# Patient Record
Sex: Male | Born: 1975 | Race: White | Hispanic: No | Marital: Single | Smoking: Never smoker
Health system: Southern US, Community
[De-identification: ages and names within clinical notes are randomized; demographics above are authoritative.]

## PROBLEM LIST (undated history)

## (undated) DIAGNOSIS — I10 Essential (primary) hypertension: Secondary | ICD-10-CM

## (undated) DIAGNOSIS — E785 Hyperlipidemia, unspecified: Secondary | ICD-10-CM

## (undated) HISTORY — PX: ANKLE SURGERY: SHX546

## (undated) HISTORY — DX: Essential (primary) hypertension: I10

## (undated) HISTORY — DX: Hyperlipidemia, unspecified: E78.5

---

## 2002-07-20 HISTORY — PX: ANKLE FRACTURE SURGERY: SHX122

## 2005-03-01 ENCOUNTER — Emergency Department: Payer: Self-pay | Admitting: Emergency Medicine

## 2006-09-18 ENCOUNTER — Emergency Department: Payer: Self-pay | Admitting: Emergency Medicine

## 2008-06-07 ENCOUNTER — Ambulatory Visit: Payer: Self-pay | Admitting: Gastroenterology

## 2008-06-29 ENCOUNTER — Ambulatory Visit: Payer: Self-pay | Admitting: Gastroenterology

## 2009-08-01 ENCOUNTER — Ambulatory Visit: Payer: Self-pay

## 2010-09-02 ENCOUNTER — Emergency Department: Payer: Self-pay | Admitting: Emergency Medicine

## 2011-02-19 ENCOUNTER — Emergency Department: Payer: Self-pay | Admitting: *Deleted

## 2011-04-13 ENCOUNTER — Emergency Department: Payer: Self-pay | Admitting: Emergency Medicine

## 2011-04-15 ENCOUNTER — Emergency Department: Payer: Self-pay | Admitting: Emergency Medicine

## 2011-05-11 ENCOUNTER — Telehealth: Payer: Self-pay | Admitting: *Deleted

## 2011-05-11 ENCOUNTER — Ambulatory Visit (INDEPENDENT_AMBULATORY_CARE_PROVIDER_SITE_OTHER): Payer: 59 | Admitting: Cardiovascular Disease

## 2011-05-11 ENCOUNTER — Encounter: Payer: Self-pay | Admitting: *Deleted

## 2011-05-11 ENCOUNTER — Encounter: Payer: Self-pay | Admitting: Cardiovascular Disease

## 2011-05-11 DIAGNOSIS — I10 Essential (primary) hypertension: Secondary | ICD-10-CM | POA: Insufficient documentation

## 2011-05-11 DIAGNOSIS — R079 Chest pain, unspecified: Secondary | ICD-10-CM

## 2011-05-11 DIAGNOSIS — E119 Type 2 diabetes mellitus without complications: Secondary | ICD-10-CM

## 2011-05-11 DIAGNOSIS — E1165 Type 2 diabetes mellitus with hyperglycemia: Secondary | ICD-10-CM | POA: Insufficient documentation

## 2011-05-11 DIAGNOSIS — E785 Hyperlipidemia, unspecified: Secondary | ICD-10-CM | POA: Insufficient documentation

## 2011-05-11 DIAGNOSIS — R112 Nausea with vomiting, unspecified: Secondary | ICD-10-CM

## 2011-05-11 DIAGNOSIS — R0602 Shortness of breath: Secondary | ICD-10-CM

## 2011-05-11 MED ORDER — HYDROCODONE-ACETAMINOPHEN 5-500 MG PO TABS: 2.0000 | ORAL_TABLET | Freq: Four times a day (QID) | ORAL | Status: DC | PRN

## 2011-05-11 MED ORDER — NITROGLYCERIN 0.4 MG SL SUBL: 0.4000 mg | SUBLINGUAL_TABLET | SUBLINGUAL | Status: AC | PRN

## 2011-05-11 NOTE — Telephone Encounter (Signed)
Pt notified CK elevated at 304 and CKMB and troponin normal. This probably due to vomiting on Thursday, pt says after vomiting, this is when CP had began. Pt will continue to follow instructions and take meds per Dr. Mariah Milling and will call with any changes.

## 2011-05-11 NOTE — Assessment & Plan Note (Signed)
We have encouraged continued exercise, careful diet management in an effort to lose weight. 

## 2011-05-11 NOTE — Assessment & Plan Note (Signed)
Etiology of the nausea and vomiting is uncertain. He does not think he ate something abnormal on Thursday. Initial vomiting may have contributed to his symptoms over the past several days.

## 2011-05-11 NOTE — Assessment & Plan Note (Signed)
He did mention that his blood pressure medication is extensive. We have suggested he talk with his primary care physician About changing to losartan or possibly Diovan which recently went generic.

## 2011-05-11 NOTE — Assessment & Plan Note (Signed)
Cardiac enzymes have been drawn. EKG is essentially normal. Symptoms are somewhat atypical. Symptoms seemed to start after vomiting and we are unable to exclude GI etiology, possibly a Mallory-Weiss tear. If his cardiac enzymes come back negative, we have suggested he try H2 blockers and proton pump inhibitors.  Given the severity of the pain that seems to wax and wane, we have given him nitroglycerin and several Vicodin until we are able to establish the cause of his pain.   We will likely need a stress test, possibly a routine treadmill study once his symptoms have improved.

## 2011-05-11 NOTE — Patient Instructions (Addendum)
Please take nitroglycerin as needed for chest pain (up to three, every 5 min) Please start prevacid (omeprazole) two a day (take 48 hr to kick in) Start pepcid, or pepcid AC (take a double dose) Could try  advil/aleve (for musculoskeletal) every 6 hrs Try vicodin as needed for severe pain  We have ordered cardiac enz  We should have them them back in a few hours.  Please call us if you have new issues that need to be addressed before your next appt.

## 2011-05-11 NOTE — Assessment & Plan Note (Signed)
We have encouraged him to stay on a statin. This is ideal given his very strong family history, underlying diabetes

## 2011-05-11 NOTE — Progress Notes (Signed)
   Patient ID: Nathaniel Flores, male    DOB: 05-24-1976, 35 y.o.   MRN: [id number]  HPI Comments: Nathaniel Flores is a 35 year old gentleman with history of obesity, hyperlipidemia, hypertension with a strong family history of coronary artery disease who had a father who had first heart attack at age 22, died at 21 who presents with malaise, nausea vomiting several days ago followed by chest tightness, dizziness, continued nausea.  He reports that last week he played softball and Thursday night he had nausea and vomiting. Following which he had chest tightness that seemed to come and go, and a feeling that he was on a pass out, some nausea. Symptoms were severe on Saturday. He was at a haunted house and had to leave early Secondary to severe pain. He continues to have symptoms today. Symptoms are nonexertional, are coming on at rest Amoxil to wax and wane. Continued periods of nausea.  EKG shows normal sinus rhythm with rate 90 beats per minute with no significant ST or T wave changes.   Outpatient Encounter Prescriptions as of 05/11/2011  Medication Sig Dispense Refill  . aspirin 81 MG tablet Take 81 mg by mouth daily.        . metFORMIN (GLUCOPHAGE) 1000 MG tablet Take 1,000 mg by mouth 2 (two) times daily with a meal.        . simvastatin (ZOCOR) 40 MG tablet Take 40 mg by mouth at bedtime.        Marland Kitchen telmisartan-hydrochlorothiazide (MICARDIS HCT) 80-12.5 MG per tablet Take 1 tablet by mouth daily.           Review of Systems  Constitutional: Negative.        Malaise  HENT: Negative.   Eyes: Negative.   Respiratory: Negative.   Cardiovascular: Positive for chest pain.  Gastrointestinal: Positive for nausea and vomiting.  Musculoskeletal: Negative.   Skin: Negative.   Neurological: Negative.   Hematological: Negative.   Psychiatric/Behavioral: Negative.   All other systems reviewed and are negative.    BP 138/72  Pulse 90  Ht 5\' 8"  (1.727 m)  Wt 253 lb (114.76 kg)  BMI 38.47  kg/m2   Physical Exam  Nursing note and vitals reviewed. Constitutional: He is oriented to person, place, and time. He appears well-developed and well-nourished.  HENT:  Head: Normocephalic.  Nose: Nose normal.  Mouth/Throat: Oropharynx is clear and moist.  Eyes: Conjunctivae are normal. Pupils are equal, round, and reactive to light.  Neck: Normal range of motion. Neck supple. No JVD present.  Cardiovascular: Normal rate, regular rhythm, S1 normal, S2 normal, normal heart sounds and intact distal pulses.  Exam reveals no gallop and no friction rub.   No murmur heard. Pulmonary/Chest: Effort normal and breath sounds normal. No respiratory distress. He has no wheezes. He has no rales. He exhibits no tenderness.  Abdominal: Soft. Bowel sounds are normal. He exhibits no distension. There is no tenderness.  Musculoskeletal: Normal range of motion. He exhibits no edema and no tenderness.  Lymphadenopathy:    He has no cervical adenopathy.  Neurological: He is alert and oriented to person, place, and time. Coordination normal.  Skin: Skin is warm and dry. No rash noted. No erythema.  Psychiatric: He has a normal mood and affect. His behavior is normal. Judgment and thought content normal.           Assessment and Plan

## 2011-05-12 LAB — TROPONIN I: Troponin I: <0.31 ng/mL (ref <0.31)

## 2012-01-29 ENCOUNTER — Emergency Department: Payer: Self-pay | Admitting: Emergency Medicine

## 2012-01-29 LAB — URINALYSIS, COMPLETE
Bacteria: NONE SEEN
Bilirubin,UR: NEGATIVE
Glucose,UR: NEGATIVE mg/dL (ref 0–75)
Leukocyte Esterase: NEGATIVE
Nitrite: NEGATIVE
Specific Gravity: 1.025 (ref 1.003–1.030)
Squamous Epithelial: NONE SEEN
WBC UR: 2 /HPF (ref 0–5)

## 2012-01-29 LAB — BASIC METABOLIC PANEL
Anion Gap: 9 (ref 7–16)
BUN: 15 mg/dL (ref 7–18)
Calcium, Total: 8.9 mg/dL (ref 8.5–10.1)
Chloride: 105 mmol/L (ref 98–107)
Co2: 25 mmol/L (ref 21–32)
Creatinine: 1.04 mg/dL (ref 0.60–1.30)
EGFR (Non-African Amer.): >60
Glucose: 137 mg/dL — ABNORMAL HIGH (ref 65–99)
Osmolality: 281 (ref 275–301)
Potassium: 3.9 mmol/L (ref 3.5–5.1)
Sodium: 139 mmol/L (ref 136–145)

## 2012-01-29 LAB — CBC
HCT: 43.7 % (ref 40.0–52.0)
HGB: 13.7 g/dL (ref 13.0–18.0)
MCH: 21 pg — ABNORMAL LOW (ref 26.0–34.0)
MCHC: 31.4 g/dL — ABNORMAL LOW (ref 32.0–36.0)
MCV: 67 fL — ABNORMAL LOW (ref 80–100)
Platelet: 190 10*3/uL (ref 150–440)
RBC: 6.53 10*6/uL — ABNORMAL HIGH (ref 4.40–5.90)
RDW: 16.3 % — ABNORMAL HIGH (ref 11.5–14.5)
WBC: 9.4 10*3/uL (ref 3.8–10.6)

## 2012-02-15 ENCOUNTER — Encounter: Payer: Self-pay | Admitting: Internal Medicine

## 2012-02-15 ENCOUNTER — Ambulatory Visit (INDEPENDENT_AMBULATORY_CARE_PROVIDER_SITE_OTHER): Payer: 59 | Admitting: Internal Medicine

## 2012-02-15 VITALS — BP 126/70 | HR 104 | Temp 98.0°F | Resp 16 | Ht 67.5 in | Wt 294.2 lb

## 2012-02-15 DIAGNOSIS — Z6841 Body Mass Index (BMI) 40.0 and over, adult: Secondary | ICD-10-CM

## 2012-02-15 DIAGNOSIS — R079 Chest pain, unspecified: Secondary | ICD-10-CM

## 2012-02-15 DIAGNOSIS — E669 Obesity, unspecified: Secondary | ICD-10-CM

## 2012-02-15 DIAGNOSIS — E785 Hyperlipidemia, unspecified: Secondary | ICD-10-CM

## 2012-02-15 DIAGNOSIS — E119 Type 2 diabetes mellitus without complications: Secondary | ICD-10-CM

## 2012-02-15 DIAGNOSIS — K625 Hemorrhage of anus and rectum: Secondary | ICD-10-CM

## 2012-02-15 MED ORDER — DOCUSATE SODIUM 100 MG PO CAPS: 100.0000 mg | ORAL_CAPSULE | Freq: Two times a day (BID) | ORAL | Status: AC

## 2012-02-15 MED ORDER — METFORMIN HCL 1000 MG PO TABS: 1000.0000 mg | ORAL_TABLET | Freq: Two times a day (BID) | ORAL | Status: DC

## 2012-02-15 MED ORDER — HYDROCORTISONE ACETATE 25 MG RE SUPP: 25.0000 mg | Freq: Two times a day (BID) | RECTAL | Status: AC

## 2012-02-15 NOTE — Progress Notes (Signed)
Patient ID: Nathaniel Flores, male   DOB: 1975/10/18, 36 y.o.   MRN: [id number]  Patient Active Problem List  Diagnosis  . Chest pain at rest  . Nausea & vomiting  . Hyperlipidemia  . Diabetes mellitus type 2 in obese  . Obesity, Class III, BMI 40-49.9 (morbid obesity)  . Morbid obesity with BMI of 40.0-44.9, adult  . Rectal bleeding    Subjective:  CC:   Chief Complaint  Patient presents with  . New Patient    HPI:   Nathaniel Flores a 36 y.o. male who presents with 1) Weight gain of 50 lbs in 9 months.  Had recently lost 40 lbs after having an episode of chest pain  and undergoing evaluation by [facility] but gained all of it back and more.  nNot exercising or following a diet.  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 2)   He recently passed a kidney stone last week after having hematuria which started a week ago,  CT scan during ER evaluation showed a 4 .5 mm stone on the right .  3) rectal bleeding for one month. Every stool accompanied by rectal irritation. Passes hard stools often.     Past Medical History  Diagnosis Date  . Hypertension   . Hyperlipidemia   . Diabetes mellitus     borderline    Past Surgical History  Procedure Date  . Ankle surgery   . Ankle fracture surgery 2004         The following portions of the patient's history were reviewed and updated as appropriate: Allergies, current medications, and problem list.    Review of Systems:   12 Pt  review of systems was negative except those addressed in the HPI,     History   Social History  . Marital Status: Single    Spouse Name: N/A    Number of Children: N/A  . Years of Education: N/A   Occupational History  . Not on file.   Social History Main Topics  . Smoking status: Never Smoker   . Smokeless tobacco: Never Used  . Alcohol Use: 0.6 oz/week    1 Cans of beer per week  . Drug Use: No  . Sexually Active: Not on file   Other Topics Concern  . Not  on file   Social History Narrative  . No narrative on file    Objective:  BP 126/70  Pulse 104  Temp 98 F (36.7 C) (Oral)  Resp 16  Ht 5' 7.5" (1.715 m)  Wt 294 lb 4 oz (133.471 kg)  BMI 45.41 kg/m2  SpO2 95%  General appearance: alert, cooperative and appears stated age Ears: normal TM's and external ear canals both ears Throat: lips, mucosa, and tongue normal; teeth and gums normal Neck: no adenopathy, no carotid bruit, supple, symmetrical, trachea midline and thyroid not enlarged, symmetric, no tenderness/mass/nodules Back: symmetric, no curvature. ROM normal. No CVA tenderness. Lungs: clear to auscultation bilaterally Heart: regular rate and rhythm, S1, S2 normal, no murmur, click, rub or gallop Abdomen: soft, non-tender; bowel sounds normal; no masses,  no organomegaly Rectal: hemorrhoids, not bleeding  Pulses: 2+ and symmetric Skin: Skin color, texture, turgor normal. No rashes or lesions Lymph nodes: Cervical, supraclavicular, and axillary nodes normal.  Assessment and Plan:  Hyperlipidemia Managed with zocor , goal will be 70 to 100 depending on presence of diabetes.   Obesity, Class III, BMI 40-49.9 (morbid obesity)  I have addressed  BMI and recommended a low glycemic index diet utilizing smaller more frequent meals to increase metabolism.  I have also recommended that patient start exercising with a goal of 30 minutes of aerobic exercise a minimum of 5 days per week. Screening for lipid disorders, thyroid and diabetes to be done today.    Rectal bleeding Exam is consistent with hemorrhoids,  No current bleeding.  Colace, anusol .  He had a colonoscopy in 2009 for same which was normal.   Chest pain at rest With history of heartburn,  Normal EGD done n 2009. Cardiology evaluation in progress but initial ekg and cardiac enzymes are normal.  Diabetes mellitus type 2 in obese Low GI diet recommended, return for hgba1c.   Updated Medication List Outpatient  Encounter Prescriptions as of 02/15/2012  Medication Sig Dispense Refill  . aspirin 81 MG tablet Take 81 mg by mouth daily.        . metFORMIN (GLUCOPHAGE) 1000 MG tablet Take 1 tablet (1,000 mg total) by mouth 2 (two) times daily with a meal.  60 tablet  3  . nitroGLYCERIN (NITROSTAT) 0.4 MG SL tablet Place 1 tablet (0.4 mg total) under the tongue every 5 (five) minutes as needed for chest pain.  25 tablet  6  . simvastatin (ZOCOR) 40 MG tablet Take 40 mg by mouth at bedtime.        Marland Kitchen DISCONTD: metFORMIN (GLUCOPHAGE) 1000 MG tablet Take 1,000 mg by mouth 2 (two) times daily with a meal.       . docusate sodium (COLACE) 100 MG capsule Take 1 capsule (100 mg total) by mouth 2 (two) times daily.  20 capsule  0  . hydrocortisone (ANUSOL-HC) 25 MG suppository Place 1 suppository (25 mg total) rectally 2 (two) times daily.  12 suppository  0  . DISCONTD: HYDROcodone-acetaminophen (VICODIN) 5-500 MG per tablet Take 2 tablets by mouth every 6 (six) hours as needed for pain.  20 tablet  0  . DISCONTD: telmisartan-hydrochlorothiazide (MICARDIS HCT) 80-12.5 MG per tablet Take 1 tablet by mouth daily.           Orders Placed This Encounter  Procedures  . Hemoglobin A1c  . LDL cholesterol, direct  . Lipid panel  . COMPLETE METABOLIC PANEL WITH GFR  . Microalbumin / creatinine urine ratio  . CBC with Differential  . TSH  . HM COLONOSCOPY    Return in about 3 months (around 05/17/2012).

## 2012-02-15 NOTE — Patient Instructions (Addendum)
Return for fasting labs as soon as possible. (Make an appt with front desk)  I am calling in  A steroid suppository for you internal hemorrhoids., and a stool softener.      Consider a Low Glycemic Index Diet and eating 6 smaller meals daily .  This frequent feeding stimulates your metabolism and the lower glycemic index foods will lower your blood sugars:   This is an example of my daily  "Low GI"  Diet:  All of the foods can be found at grocery stores and in bulk at [employer]  club   7 AM Breakfast:  Low carbohydrate Protein  Shakes (I recommend the EAS AdvantEdge "Carb Control" shakes  Or the low carb shakes by Atkins.   Both are available everywhere:  In  cases at [employer]Js  Or in 4 packs at grocery stores and pharmacies  2.5 carbs  (Alternative is  a toasted Arnold's Sandwhich Thin w/ peanut butter, a "Bagel Thin" with cream cheese and salmon) or  a scrambled egg burrito made with a low carb tortilla .  Avoid cereal and bananas, oatmeal too unless the old fashioned kind that takes 30-40 minutes to prepare.  the rest is overly processed, has minimal fiber, and loaded with carbohydrates!   10 AM: Protein bar by Atkins (the snack size, under 200 cal.  There are many varieties , available widely again or in bulk in limited varieties at [employer]Js)  Other so called "protein bars" tend to be loaded with carbohydrates.  Remember, in food advertising, the word "energy" is synonymous for " carbohydrate."  Lunch: sandwich of [place], (or any lunchmeat or canned tuna), fresh avocado and cheese on a lower carbohydrate pita bread, flatbread, or tortilla . Ok to use mayonnaise. The bread is the only source or carbohydrate that can be decreased (Joseph's makes a pita bread and a flat bread  Are 50 cal and 4 net carbs ; Toufayan makes a low carb flatbread 100 cal and 9 net carbs  and  Mission makes a low carb whole wheat tortilla  210 cal and 6 net carbs)  3 PM:  Mid day :  Another protein bar,  Or a  cheese stick (100 cal, 0  carbs),  Or 1 ounce of  almonds, walnuts, pistachios, pecans, peanuts,  Macadamia nuts. Or a Dannon light n Fit greek yogurt, 80 cal 8 net carbs . Avoid "granola"; the dried cranberries and raisins are loaded with carbohydrates.    6 PM  Dinner:  "mean and green:"  Meat/chicken/fish or a high protein legume; , with a green salad, and a low GI  Veggie (broccoli, cauliflower, green beans, spinach, brussel sprouts. Lima beans) : Avoid "Low fat dressings, Reyne Dumas and [address]! They are loaded with sugar! Instead use ranch, vinagrette,  Blue cheese, etc  9 PM snack : Breyer's "low carb" fudgsicle or  ice cream bar (Carb Smart line), or  Weight Watcher's ice cream bar , or anouther "no sugar added" ice cream; or another protein shake or a serving of fresh fruit with whipped cream (Avoid bananas, pineapple, grapes  and watermelon on a regular basis because they are high in sugar)   Remember that snack Substitutions should be less than 15 to 20 carbs  Per serving. Remember to subtract fiber grams to get the "net carbs."

## 2012-02-16 ENCOUNTER — Encounter: Payer: Self-pay | Admitting: Internal Medicine

## 2012-02-16 DIAGNOSIS — K625 Hemorrhage of anus and rectum: Secondary | ICD-10-CM | POA: Insufficient documentation

## 2012-02-16 NOTE — Assessment & Plan Note (Addendum)
With history of heartburn,  Normal EGD done n 2009. Cardiology evaluation in progress but initial ekg and cardiac enzymes are normal.

## 2012-02-16 NOTE — Assessment & Plan Note (Addendum)
Exam is consistent with hemorrhoids,  No current bleeding.  Colace, anusol .  He had a colonoscopy in 2009 for same which was normal.

## 2012-02-16 NOTE — Assessment & Plan Note (Signed)
I have addressed  BMI and recommended a low glycemic index diet utilizing smaller more frequent meals to increase metabolism.  I have also recommended that patient start exercising with a goal of 30 minutes of aerobic exercise a minimum of 5 days per week. Screening for lipid disorders, thyroid and diabetes to be done today.   

## 2012-02-16 NOTE — Assessment & Plan Note (Signed)
Managed with zocor , goal will be 70 to 100 depending on presence of diabetes.

## 2012-02-16 NOTE — Assessment & Plan Note (Signed)
Low GI diet recommended, return for hgba1c.

## 2012-03-15 ENCOUNTER — Other Ambulatory Visit: Payer: Self-pay | Admitting: *Deleted

## 2012-03-15 MED ORDER — SIMVASTATIN 40 MG PO TABS: 40.0000 mg | ORAL_TABLET | Freq: Every day | ORAL | Status: DC

## 2012-03-15 MED ORDER — TELMISARTAN-HCTZ 80-12.5 MG PO TABS: 1.0000 | ORAL_TABLET | Freq: Every day | ORAL | Status: DC

## 2012-04-21 ENCOUNTER — Telehealth: Payer: Self-pay | Admitting: Internal Medicine

## 2012-04-21 NOTE — Telephone Encounter (Signed)
Caller: Eldin/Patient; Patient Name: Nathaniel Flores; PCP: Duncan Dull (Adults only); Best Callback Phone Number: [phone]; Reason for call: Onset- 04/13/12. Intermittent in nature.    Describes pain in Left upper quadrant and radiates across abdomen and down into right groin. Pain occasionally in right scrotum.  No pain with urination.  +Nausea , no vomiting, Diarrhea + x5 daily.  Every time he eats he has diarrhea.  Dark Brown, liquid.  Afebrile. No tenderness, no bloating. Patient is currently at work.  He has appointment scheduled by the office for tomorrow.  Emergent s/sx ruled out per Abdominal Pain Protocol with exception to "Diarrhea lasting longer than 24 hours and not improving with home care". See Provider in 24 hours. Reviewed home care with patient .  Understanding expressed. He will closley mointor s/sx and call back for quesitons, changes or concerns.

## 2012-04-22 ENCOUNTER — Encounter: Payer: Self-pay | Admitting: Internal Medicine

## 2012-04-22 ENCOUNTER — Ambulatory Visit (INDEPENDENT_AMBULATORY_CARE_PROVIDER_SITE_OTHER): Payer: 59 | Admitting: Internal Medicine

## 2012-04-22 VITALS — BP 120/70 | HR 86 | Temp 98.6°F | Ht 68.5 in | Wt 291.5 lb

## 2012-04-22 DIAGNOSIS — E669 Obesity, unspecified: Secondary | ICD-10-CM

## 2012-04-22 DIAGNOSIS — E66813 Obesity, class 3: Secondary | ICD-10-CM

## 2012-04-22 DIAGNOSIS — R197 Diarrhea, unspecified: Secondary | ICD-10-CM

## 2012-04-22 DIAGNOSIS — E1169 Type 2 diabetes mellitus with other specified complication: Secondary | ICD-10-CM

## 2012-04-22 DIAGNOSIS — Z23 Encounter for immunization: Secondary | ICD-10-CM

## 2012-04-22 DIAGNOSIS — E119 Type 2 diabetes mellitus without complications: Secondary | ICD-10-CM

## 2012-04-22 DIAGNOSIS — K625 Hemorrhage of anus and rectum: Secondary | ICD-10-CM

## 2012-04-22 NOTE — Patient Instructions (Addendum)
Please stop the diet sodas and  Sugar free stuff for right now. Clear liquids for another 24 hours,  (gatorade , chicken broth and jello)  AVOID SALAD FOR A WEEK   Use Immodium as needed (up to 8 tablets daily) to control diarrhea    This is  Dr. Norton Blizzard version of a  "Low GI"  Diet:  All of the foods can be found at grocery stores and in bulk at [employer].  The Atkins protein bars and shakes are available in more varieties at [employer], [employer] and [employer].     7 AM Breakfast:  Low carbohydrate Protein  Shakes (I recommend the EAS AdvantEdge "Carb Control" shakes  Or the low carb shakes by Atkins.   Both are available everywhere:  In  cases at BJs  Or in 4 packs at grocery stores and pharmacies  2.5 carbs  (Alternative is  a toasted Arnold's Sandwhich Thin w/ peanut butter, a "Bagel Thin" with cream cheese and salmon) or  a scrambled egg burrito made with a low carb tortilla .  Avoid cereal and bananas, oatmeal too unless you are cooking the old fashioned kind that takes 30-40 minutes to prepare.  the rest is overly processed, has minimal fiber, and is loaded with carbohydrates!   10 AM: Protein bar by Atkins (the snack size, under 200 cal).  There are many varieties , available widely again or in bulk in limited varieties at BJs)  Other so called "protein bars" tend to be loaded with carbohydrates.  Remember, in food advertising, the word "energy" is synonymous for " carbohydrate."  Lunch: sandwich of [place], (or any lunchmeat, grilled meat or canned tuna), fresh avocado, mayonnaise  and cheese on a lower carbohydrate pita bread, flatbread, or tortilla . Ok to use regular mayonnaise. The bread is the only source or carbohydrate that can be decreased (Joseph's makes a pita bread and a flat bread that are 50 cal and 4 net carbs ; Toufayan makes a low carb flatbread that's 100 cal and 9 net carbs  and  Mission makes a low carb whole wheat tortilla  That is 210 cal and 6 net carbs)  3 PM:  Mid day  :  Another protein bar,  Or a  cheese stick (100 cal, 0 carbs),  Or 1 ounce of  almonds, walnuts, pistachios, pecans, peanuts,  Macadamia nuts. Or a Dannon light n Fit greek yogurt, 80 cal 8 net carbs . Avoid "granola"; the dried cranberries and raisins are loaded with carbohydrates. Mixed nuts ok if no raisins or cranberries or dried fruit.      6 PM  Dinner:  "mean and green:"  Meat/chicken/fish or a high protein legume; , with a green salad, and a low GI  Veggie (broccoli, cauliflower, green beans, spinach, brussel sprouts. Lima beans) : Avoid "Low fat dressings, as well as Reyne Dumas and [address]! They are loaded with sugar! Instead use ranch, vinagrette,  Blue cheese, etc  9 PM snack : Breyer's "low carb" fudgsicle or  ice cream bar (Carb Smart line), or  Weight Watcher's ice cream bar , or another "no sugar added" ice cream;a serving of fresh berries/cherries with whipped cream (Avoid bananas, pineapple, grapes  and watermelon on a regular basis because they are high in sugar)   Remember that snack Substitutions should be less than 15 to 20 carbs  Per serving. Remember to subtract fiber grams and sugar alcohols to get the "net carbs."

## 2012-04-22 NOTE — Progress Notes (Signed)
Patient ID: Nathaniel Flores, male   DOB: 1976/06/11, 36 y.o.   MRN: [id number]  Patient Active Problem List  Diagnosis  . Chest pain at rest  . Nausea & vomiting  . Hyperlipidemia  . Diabetes mellitus type 2 in obese  . Obesity, Class III, BMI 40-49.9 (morbid obesity)  . Morbid obesity with BMI of 40.0-44.9, adult  . Rectal bleeding  . Diarrhea in adult patient    Subjective:  CC:   Chief Complaint  Patient presents with  . Abdominal Pain    HPI:   Nathaniel Flores a 36 y.o. male who presents  Past Medical History  Diagnosis Date  . Hypertension   . Hyperlipidemia   . Diabetes mellitus     borderline    Past Surgical History  Procedure Date  . Ankle surgery   . Ankle fracture surgery 2004         The following portions of the patient's history were reviewed and updated as appropriate: Allergies, current medications, and problem list.    Review of Systems:   12 Pt  review of systems was negative except those addressed in the HPI,     History   Social History  . Marital Status: Single    Spouse Name: N/A    Number of Children: N/A  . Years of Education: N/A   Occupational History  . Not on file.   Social History Main Topics  . Smoking status: Never Smoker   . Smokeless tobacco: Never Used  . Alcohol Use: 0.6 oz/week    1 Cans of beer per week  . Drug Use: No  . Sexually Active: Not on file   Other Topics Concern  . Not on file   Social History Narrative  . No narrative on file    Objective:  BP 120/70  Pulse 86  Temp 98.6 F (37 C) (Oral)  Ht 5' 8.5" (1.74 m)  Wt 291 lb 8 oz (132.224 kg)  BMI 43.68 kg/m2  SpO2 97%  General appearance: alert, cooperative and appears stated age Ears: normal TM's and external ear canals both ears Throat: lips, mucosa, and tongue normal; teeth and gums normal Neck: no adenopathy, no carotid bruit, supple, symmetrical, trachea midline and thyroid not enlarged, symmetric, no  tenderness/mass/nodules Back: symmetric, no curvature. ROM normal. No CVA tenderness. Lungs: clear to auscultation bilaterally Heart: regular rate and rhythm, S1, S2 normal, no murmur, click, rub or gallop Abdomen: obese, soft, tender in LUQ without rebound or guarding; bowel sounds normal; no masses,  no organomegaly Pulses: 2+ and symmetric Skin: Skin color, texture, turgor normal. No rashes or lesions Lymph nodes: Cervical, supraclavicular, and axillary nodes normal.  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  Will have him stop diet sodas,  Followed by suspension of metformin.  If no improvement,  Stool studies.  Given histoyr of LUQ pain with radiation to RLQ and scrotum, will consider CT abdomen and pelvis vs GI referral.  Obesity, Class III, BMI 40-49.9 (morbid obesity) Again reviewed low GI diet given concurrent DM..  hadnout given   Diabetes mellitus type 2 in obese He is overdue or hgba1c and does not check blood sugars.  Reminded to return for fasting labs.    Updated Medication List Outpatient Encounter Prescriptions as of 04/22/2012  Medication Sig Dispense Refill  . aspirin 81 MG tablet Take 81 mg by mouth daily.        . metFORMIN (GLUCOPHAGE)  1000 MG tablet Take 1 tablet (1,000 mg total) by mouth 2 (two) times daily with a meal.  60 tablet  3  . telmisartan-hydrochlorothiazide (MICARDIS HCT) 80-12.5 MG per tablet Take 1 tablet by mouth daily.  30 tablet  3  . nitroGLYCERIN (NITROSTAT) 0.4 MG SL tablet Place 1 tablet (0.4 mg total) under the tongue every 5 (five) minutes as needed for chest pain.  25 tablet  6  . simvastatin (ZOCOR) 40 MG tablet Take 1 tablet (40 mg total) by mouth at bedtime.  30 tablet  3     Orders Placed This Encounter  Procedures  . Tdap vaccine greater than or equal to 7yo IM  . Flu vaccine greater than or equal to 3yo preservative free IM    No Follow-up on file.

## 2012-04-24 ENCOUNTER — Encounter: Payer: Self-pay | Admitting: Internal Medicine

## 2012-04-24 DIAGNOSIS — R197 Diarrhea, unspecified: Secondary | ICD-10-CM | POA: Insufficient documentation

## 2012-04-24 NOTE — Assessment & Plan Note (Signed)
He is overdue or hgba1c and does not check blood sugars.  Reminded to return for fasting labs.

## 2012-04-24 NOTE — Assessment & Plan Note (Signed)
Again reviewed low GI diet given concurrent DM.Uvaldo Bristle given

## 2012-04-24 NOTE — Assessment & Plan Note (Addendum)
Chronic,  With normal colonoscopy 2009.  Will have him stop diet sodas,  Followed by suspension of metformin.  If no improvement,  Stool studies.  Given histoyr of LUQ pain with radiation to RLQ and scrotum, will consider CT abdomen and pelvis vs GI referral.

## 2012-04-25 ENCOUNTER — Other Ambulatory Visit: Payer: 59

## 2012-05-18 ENCOUNTER — Ambulatory Visit: Payer: 59 | Admitting: Internal Medicine

## 2012-05-18 DIAGNOSIS — Z0289 Encounter for other administrative examinations: Secondary | ICD-10-CM

## 2012-06-18 ENCOUNTER — Other Ambulatory Visit: Payer: Self-pay | Admitting: Internal Medicine

## 2012-06-19 NOTE — Telephone Encounter (Signed)
Nathaniel Flores refills have been denied because he has not had any labs done in over one year so I cannot refill them until he gets the labs done that were ordered in July.

## 2012-06-20 ENCOUNTER — Other Ambulatory Visit: Payer: Self-pay | Admitting: Internal Medicine

## 2012-06-20 NOTE — Telephone Encounter (Signed)
Spoke to patient advised him of the denial of medication, he stated that he will come in next week to have his labs done.

## 2012-07-18 ENCOUNTER — Other Ambulatory Visit: Payer: Self-pay | Admitting: Internal Medicine

## 2012-07-18 NOTE — Telephone Encounter (Signed)
Please advise med says no refills available.

## 2012-08-02 ENCOUNTER — Emergency Department: Payer: Self-pay | Admitting: Emergency Medicine

## 2012-08-02 ENCOUNTER — Other Ambulatory Visit: Payer: Self-pay | Admitting: General Practice

## 2012-08-02 LAB — URINALYSIS, COMPLETE
Bilirubin,UR: NEGATIVE
Glucose,UR: NEGATIVE mg/dL (ref 0–75)
Leukocyte Esterase: NEGATIVE
Ph: 5 (ref 4.5–8.0)
Squamous Epithelial: <1
WBC UR: <1 /HPF (ref 0–5)

## 2012-08-02 LAB — CBC
HCT: 43.8 % (ref 40.0–52.0)
MCHC: 31.3 g/dL — ABNORMAL LOW (ref 32.0–36.0)
MCV: 67 fL — ABNORMAL LOW (ref 80–100)
Platelet: 213 10*3/uL (ref 150–440)
RDW: 15.8 % — ABNORMAL HIGH (ref 11.5–14.5)

## 2012-08-02 LAB — BASIC METABOLIC PANEL
Anion Gap: 7 (ref 7–16)
EGFR (Non-African Amer.): >60
Glucose: 120 mg/dL — ABNORMAL HIGH (ref 65–99)
Osmolality: 277 (ref 275–301)
Sodium: 137 mmol/L (ref 136–145)

## 2012-08-02 MED ORDER — TELMISARTAN-HCTZ 80-12.5 MG PO TABS: ORAL_TABLET | ORAL | Status: DC

## 2012-08-02 MED ORDER — METFORMIN HCL 1000 MG PO TABS: ORAL_TABLET | ORAL | Status: DC

## 2012-08-02 NOTE — Telephone Encounter (Signed)
Med filled until pt appt on 08/22/12 only.

## 2012-08-03 ENCOUNTER — Other Ambulatory Visit: Payer: Self-pay | Admitting: Internal Medicine

## 2012-08-22 ENCOUNTER — Ambulatory Visit: Payer: 59 | Admitting: Internal Medicine

## 2012-08-24 ENCOUNTER — Encounter: Payer: Self-pay | Admitting: Internal Medicine

## 2012-08-24 ENCOUNTER — Ambulatory Visit (INDEPENDENT_AMBULATORY_CARE_PROVIDER_SITE_OTHER): Payer: BC Managed Care – PPO | Admitting: Internal Medicine

## 2012-08-24 ENCOUNTER — Ambulatory Visit: Payer: BC Managed Care – PPO

## 2012-08-24 VITALS — BP 130/78 | HR 100 | Temp 98.1°F | Resp 16 | Wt 294.2 lb

## 2012-08-24 DIAGNOSIS — R300 Dysuria: Secondary | ICD-10-CM

## 2012-08-24 DIAGNOSIS — R7401 Elevation of levels of liver transaminase levels: Secondary | ICD-10-CM

## 2012-08-24 DIAGNOSIS — R7402 Elevation of levels of lactic acid dehydrogenase (LDH): Secondary | ICD-10-CM

## 2012-08-24 DIAGNOSIS — E1169 Type 2 diabetes mellitus with other specified complication: Secondary | ICD-10-CM

## 2012-08-24 DIAGNOSIS — E669 Obesity, unspecified: Secondary | ICD-10-CM

## 2012-08-24 DIAGNOSIS — N200 Calculus of kidney: Secondary | ICD-10-CM | POA: Insufficient documentation

## 2012-08-24 DIAGNOSIS — E119 Type 2 diabetes mellitus without complications: Secondary | ICD-10-CM

## 2012-08-24 DIAGNOSIS — K4090 Unilateral inguinal hernia, without obstruction or gangrene, not specified as recurrent: Secondary | ICD-10-CM

## 2012-08-24 LAB — MICROALBUMIN / CREATININE URINE RATIO
Creatinine,U: 245 mg/dL
Microalb, Ur: 5.2 mg/dL — ABNORMAL HIGH (ref 0.0–1.9)

## 2012-08-24 LAB — URINALYSIS
Ketones, ur: NEGATIVE
Specific Gravity, Urine: >=1.03 (ref 1.000–1.030)
Total Protein, Urine: NEGATIVE
Urine Glucose: NEGATIVE
Urobilinogen, UA: 0.2 (ref 0.0–1.0)
pH: 6 (ref 5.0–8.0)

## 2012-08-24 LAB — TSH: TSH: 2.64 u[IU]/mL (ref 0.35–5.50)

## 2012-08-24 LAB — POCT UA - GLUCOSE/PROTEIN
Glucose, UA: NEGATIVE
Protein, UA: NEGATIVE

## 2012-08-24 LAB — HEMOGLOBIN A1C: Hgb A1c MFr Bld: 7 % — ABNORMAL HIGH (ref 4.6–6.5)

## 2012-08-24 LAB — LDL CHOLESTEROL, DIRECT: Direct LDL: 135.7 mg/dL

## 2012-08-24 MED ORDER — NYSTATIN 100000 UNIT/GM EX OINT: TOPICAL_OINTMENT | Freq: Two times a day (BID) | CUTANEOUS | Status: DC

## 2012-08-24 MED ORDER — CIPROFLOXACIN HCL 250 MG PO TABS: 250.0000 mg | ORAL_TABLET | Freq: Two times a day (BID) | ORAL | Status: DC

## 2012-08-24 NOTE — Assessment & Plan Note (Signed)
Well controlled on current regimen. A1c is  7.0 Renal function stable.  Continue asa. Metformin, statin and ARB.  , no changes today.

## 2012-08-24 NOTE — Assessment & Plan Note (Signed)
Referral to Adela Glimpse for surgical correction.

## 2012-08-24 NOTE — Progress Notes (Signed)
Patient ID: Nathaniel Flores, male   DOB: [birth year], 37 y.o.   MRN: [id number]    Patient Active Problem List  Diagnosis  . Hyperlipidemia  . Diabetes mellitus type 2 in obese  . Morbid obesity with BMI of 40.0-44.9, adult  . Rectal bleeding  . Nephrolithiasis  . Inguinal hernia    Subjective:  CC:   Chief Complaint  Patient presents with  . Follow-up    Med refill    HPI:   Nathaniel Flores a 37 y.o. male who presents follow up on diabetes.,  Medical noncompliance.  He has not had labs since 2012.  Has various reasons why he did not return for labs, including loss of insurance in October.  His home blood sugars have bee consistently in the the 114 to 121 per patient.  No hypoglycemic symptoms or neuropathy .  He has reduced medication due to GI side effects of metformin.   He was seen in the ER 2 weeks ago for recurrent nephrolithiasis   . A CT scan done after he waited for 7 hours before an MD saw him . He was diagnosed with a  left inguinal Hernia and kidney stones, all < 2 mm in diameter. He has passed one stone thus far.  He has reduced his consumption of  tea and soft drinks and is drinking more water. He is taking flomax.   He has been in pain for over a week and they only gave him 4 days of medication .  Feels another stone is moving and is having scrotal pain and dysuria.     Past Medical History  Diagnosis Date  . Hypertension   . Hyperlipidemia   . Diabetes mellitus     borderline    Past Surgical History  Procedure Date  . Ankle surgery   . Ankle fracture surgery 2004    The following portions of the patient's history were reviewed and updated as appropriate: Allergies, current medications, and problem list.    Review of Systems:   Patient denies headache, fevers, malaise, unintentional weight loss, skin rash, eye pain, sinus congestion and sinus pain, sore throat, dysphagia,  hemoptysis , cough, dyspnea, wheezing, chest pain, palpitations, orthopnea, edema,  abdominal pain, nausea, melena, diarrhea, constipation, flank pain, dysuria, hematuria, urinary  Frequency, nocturia, numbness, tingling, seizures,  Focal weakness, Loss of consciousness,  Tremor, insomnia, depression, anxiety, and suicidal ideation.     Objective:  BP 130/78  Pulse 100  Temp 98.1 F (36.7 C) (Oral)  Resp 16  Wt 294 lb 4 oz (133.471 kg)  SpO2 97%  General appearance: alert, cooperative and appears stated age Ears: normal TM's and external ear canals both ears Throat: lips, mucosa, and tongue normal; teeth and gums normal Neck: no adenopathy, no carotid bruit, supple, symmetrical, trachea midline and thyroid not enlarged, symmetric, no tenderness/mass/nodules Back: symmetric, no curvature. ROM normal. No CVA tenderness. Lungs: clear to auscultation bilaterally Heart: regular rate and rhythm, S1, S2 normal, no murmur, click, rub or gallop Abdomen: soft, non-tender; bowel sounds normal; no masses,  no organomegaly Pulses: 2+ and symmetric Skin: Skin color, texture, turgor normal. No rashes or lesions Lymph nodes: Cervical, supraclavicular, and axillary nodes normal.  Assessment and Plan:  Nephrolithiasis continue flomax,  Adding cipro for empiric prevention of cystitis.    Inguinal hernia Referral to Adela Glimpse for surgical correction.  Diabetes mellitus type 2 in obese Well controlled on current regimen. A1c is  7.0 Renal function stable.  Continue  asa. Metformin, statin and ARB.  , no changes today.   Updated Medication List Outpatient Encounter Prescriptions as of 08/24/2012  Medication Sig Dispense Refill  . aspirin 81 MG tablet Take 81 mg by mouth daily.        . metFORMIN (GLUCOPHAGE) 1000 MG tablet TAKE 1 TABLET (1,000 MG TOTAL) BY MOUTH 2 (TWO) TIMES DAILY WITH A MEAL.  60 tablet  0  . simvastatin (ZOCOR) 40 MG tablet Take 1 tablet (40 mg total) by mouth at bedtime.  30 tablet  3  . telmisartan-hydrochlorothiazide (MICARDIS HCT) 80-12.5 MG per tablet  TAKE 1 TABLET BY MOUTH DAILY.  30 tablet  0  . ciprofloxacin (CIPRO) 250 MG tablet Take 1 tablet (250 mg total) by mouth 2 (two) times daily.  14 tablet  0  . nitroGLYCERIN (NITROSTAT) 0.4 MG SL tablet Place 1 tablet (0.4 mg total) under the tongue every 5 (five) minutes as needed for chest pain.  25 tablet  6  . nystatin ointment (MYCOSTATIN) Apply topically 2 (two) times daily.  30 g  0     Orders Placed This Encounter  Procedures  . Urine culture  . TSH  . Microalbumin / creatinine urine ratio  . COMPLETE METABOLIC PANEL WITH GFR  . Hemoglobin A1c  . LDL cholesterol, direct  . Ambulatory referral to General Surgery  . POCT UA - Glucose/Protein  . POCT Urinalysis Dipstick  . POCT urinalysis dipstick    No Follow-up on file.

## 2012-08-24 NOTE — Patient Instructions (Addendum)
Apply nystatin twice daily to both underarms and place a sterile gauze pad in each underarm for one week.   Antibiotic to be called to pharmacy once the urine results are back  Referral to Adela Glimpse, MD for hernia evaluation   Labs to be reivewed; will call you with results.  We may need you to return for a fasting panel.   Return in 3 months

## 2012-08-24 NOTE — Assessment & Plan Note (Signed)
continue flomax,  Adding cipro for empiric prevention of cystitis.

## 2012-08-25 LAB — COMPLETE METABOLIC PANEL WITH GFR
AST: 44 U/L — ABNORMAL HIGH (ref 0–37)
Alkaline Phosphatase: 63 U/L (ref 39–117)
BUN: 13 mg/dL (ref 6–23)
Calcium: 9.6 mg/dL (ref 8.4–10.5)
Creat: 0.93 mg/dL (ref 0.50–1.35)
GFR, Est Non African American: >89 mL/min
Glucose, Bld: 153 mg/dL — ABNORMAL HIGH (ref 70–99)

## 2012-08-25 NOTE — Addendum Note (Signed)
Addended by: Sherlene Shams on: 08/25/2012 02:31 PM   Modules accepted: Orders

## 2012-08-26 LAB — URINE CULTURE
Colony Count: NO GROWTH
Organism ID, Bacteria: NO GROWTH

## 2012-08-30 ENCOUNTER — Ambulatory Visit: Payer: Self-pay | Admitting: Internal Medicine

## 2012-08-30 ENCOUNTER — Telehealth: Payer: Self-pay | Admitting: Internal Medicine

## 2012-08-30 NOTE — Telephone Encounter (Signed)
Pt notified has a lab appt tomorrow at 10am

## 2012-08-30 NOTE — Telephone Encounter (Signed)
His liver is enlarged on ultrasound which was done because of elevated liver enzymes.  Nathaniel Flores  He needs to have the labs that I ordered last week so I can complete the workup to find out why

## 2012-08-31 ENCOUNTER — Encounter: Payer: Self-pay | Admitting: Adult Health

## 2012-08-31 ENCOUNTER — Other Ambulatory Visit (INDEPENDENT_AMBULATORY_CARE_PROVIDER_SITE_OTHER): Payer: BC Managed Care – PPO

## 2012-08-31 ENCOUNTER — Ambulatory Visit (INDEPENDENT_AMBULATORY_CARE_PROVIDER_SITE_OTHER): Payer: BC Managed Care – PPO | Admitting: Adult Health

## 2012-08-31 VITALS — BP 112/80 | HR 86 | Temp 98.0°F | Resp 14 | Ht 68.5 in | Wt 294.8 lb

## 2012-08-31 DIAGNOSIS — J020 Streptococcal pharyngitis: Secondary | ICD-10-CM

## 2012-08-31 DIAGNOSIS — R7402 Elevation of levels of lactic acid dehydrogenase (LDH): Secondary | ICD-10-CM

## 2012-08-31 LAB — IRON AND TIBC: %SAT: 21 % (ref 20–55)

## 2012-08-31 LAB — POCT RAPID STREP A (OFFICE): Rapid Strep A Screen: NEGATIVE

## 2012-08-31 LAB — FERRITIN: Ferritin: 122.2 ng/mL (ref 22.0–322.0)

## 2012-08-31 MED ORDER — HYDROCODONE-ACETAMINOPHEN 10-325 MG PO TABS: 1.0000 | ORAL_TABLET | Freq: Four times a day (QID) | ORAL | Status: DC | PRN

## 2012-08-31 MED ORDER — SIMVASTATIN 40 MG PO TABS: 40.0000 mg | ORAL_TABLET | Freq: Every day | ORAL | Status: DC

## 2012-08-31 MED ORDER — AZITHROMYCIN 250 MG PO TABS: ORAL_TABLET | ORAL | Status: DC

## 2012-08-31 NOTE — Progress Notes (Signed)
  Subjective:    Patient ID: Nathaniel Flores, male    DOB: [birth year], 37 y.o.   MRN: [id number]  HPI  Patient presents to clinic with c/o sore throat, congestion and post nasal drip. He has tried choraseptic x 2 days with no resolution. No fever or chills.  Patient also reports that when he was here on 08/24/12 Dr. Darrick Huntsman was prescribing a pain medication for nephrolithiasis and his inguinal hernia. He did not get the script.  Review of Systems  Constitutional: Negative for fever and chills.  HENT: Positive for congestion, sore throat and postnasal drip.        Irritation behind left ear.  Respiratory: Positive for cough. Negative for chest tightness and shortness of breath.   Cardiovascular: Negative for chest pain.  Gastrointestinal: Negative for nausea and vomiting.    BP 112/80  Pulse 86  Temp(Src) 98 F (36.7 C) (Oral)  Ht 5' 8.5" (1.74 m)  Wt 294 lb 12 oz (133.698 kg)  BMI 44.16 kg/m2  SpO2 95%     Objective:   Physical Exam  Constitutional: He is oriented to person, place, and time. He appears well-developed and well-nourished.  HENT:  Mouth/Throat: No oropharyngeal exudate.  Bilateral ear canal with erythema. TM without bulging or erythema. Pharyngeal erythema  Cardiovascular: Normal rate, regular rhythm and normal heart sounds.   No murmur heard. Pulmonary/Chest: Effort normal and breath sounds normal. He has no wheezes.  Neurological: He is alert and oriented to person, place, and time.  Skin: Skin is warm and dry.  Psychiatric: He has a normal mood and affect. His behavior is normal. Judgment and thought content normal.          Assessment & Plan:

## 2012-08-31 NOTE — Patient Instructions (Addendum)
  Please start your antibiotic today.  I have given you a prescription for Norco for pain associated with your kidney stones and your hernia. This medication may cause drowsiness. Use with caution. I do not recommend taking this medication and driving.   This medication contains tylenol. There is a dose limit on tylenol (no more than 4000 mg daily). Make certain not to exceed this amount.  You may take some ibuprofen but limit the dose to 400 mg twice daily. Then you can mainly take the Norco at night.  Please let us know if you are not improved within 4-5 days.

## 2012-09-01 LAB — HEPATITIS B SURFACE ANTIGEN: Hepatitis B Surface Ag: NEGATIVE

## 2012-09-02 LAB — HEPATITIS B CORE ANTIBODY, TOTAL: Hep B Core Total Ab: NEGATIVE

## 2012-09-03 ENCOUNTER — Other Ambulatory Visit: Payer: Self-pay | Admitting: Internal Medicine

## 2012-09-05 LAB — PROTEIN ELECTROPHORESIS, SERUM
Albumin ELP: 59.2 % (ref 55.8–66.1)
Alpha-2-Globulin: 12.3 % — ABNORMAL HIGH (ref 7.1–11.8)
Beta 2: 4.3 % (ref 3.2–6.5)
Beta Globulin: 7.4 % — ABNORMAL HIGH (ref 4.7–7.2)

## 2012-09-13 ENCOUNTER — Encounter: Payer: Self-pay | Admitting: Internal Medicine

## 2012-09-15 ENCOUNTER — Telehealth: Payer: Self-pay | Admitting: Internal Medicine

## 2012-09-15 NOTE — Telephone Encounter (Signed)
I called Nathaniel Flores after looking to see if this medication had been sent in, and it was sent electronically on 09/03/12. They did still have the refill and they will fill for patient. I left a detailed message on patients cell stating they had the refill since the 15th of February.

## 2012-09-15 NOTE — Telephone Encounter (Signed)
Pt has been out of b/p meds for 4 days and is needing that called in as soon as possible. [facility] [address].

## 2012-09-16 ENCOUNTER — Telehealth: Payer: Self-pay | Admitting: General Practice

## 2012-09-16 NOTE — Telephone Encounter (Signed)
Pharmacy and pt called stating that a PA was needed for Micardis. Pt has been without BP meds for 5 days. PA started and paperwork placed in Tullo's folder. Pt given samples until PA paperwork is completed.

## 2012-09-20 ENCOUNTER — Emergency Department: Payer: Self-pay | Admitting: Emergency Medicine

## 2012-09-20 LAB — URINALYSIS, COMPLETE
Bilirubin,UR: NEGATIVE
Glucose,UR: NEGATIVE mg/dL (ref 0–75)
Hyaline Cast: 1
Ketone: NEGATIVE
Leukocyte Esterase: NEGATIVE
Ph: 5 (ref 4.5–8.0)
Protein: 100
Squamous Epithelial: <1
WBC UR: 1 /HPF (ref 0–5)

## 2012-09-20 LAB — CBC
HGB: 12.5 g/dL — ABNORMAL LOW (ref 13.0–18.0)
MCH: 20.8 pg — ABNORMAL LOW (ref 26.0–34.0)
MCHC: 31.3 g/dL — ABNORMAL LOW (ref 32.0–36.0)
MCV: 67 fL — ABNORMAL LOW (ref 80–100)
Platelet: 246 10*3/uL (ref 150–440)
RBC: 6.03 10*6/uL — ABNORMAL HIGH (ref 4.40–5.90)
RDW: 15.7 % — ABNORMAL HIGH (ref 11.5–14.5)

## 2012-09-20 LAB — COMPREHENSIVE METABOLIC PANEL
Albumin: 3.5 g/dL (ref 3.4–5.0)
Alkaline Phosphatase: 82 U/L (ref 50–136)
Anion Gap: 7 (ref 7–16)
Co2: 24 mmol/L (ref 21–32)
EGFR (Non-African Amer.): >60
SGOT(AST): 51 U/L — ABNORMAL HIGH (ref 15–37)
Total Protein: 6.7 g/dL (ref 6.4–8.2)

## 2012-10-17 ENCOUNTER — Other Ambulatory Visit: Payer: Self-pay | Admitting: Internal Medicine

## 2012-10-17 NOTE — Telephone Encounter (Signed)
Med filled.  

## 2012-11-07 ENCOUNTER — Encounter: Payer: Self-pay | Admitting: *Deleted

## 2012-11-22 ENCOUNTER — Ambulatory Visit: Payer: BC Managed Care – PPO | Admitting: Internal Medicine

## 2012-12-21 ENCOUNTER — Other Ambulatory Visit: Payer: Self-pay | Admitting: Internal Medicine

## 2013-01-23 ENCOUNTER — Other Ambulatory Visit: Payer: Self-pay | Admitting: *Deleted

## 2013-01-23 MED ORDER — SIMVASTATIN 40 MG PO TABS: 40.0000 mg | ORAL_TABLET | Freq: Every day | ORAL | Status: DC

## 2013-03-01 ENCOUNTER — Emergency Department: Payer: Self-pay | Admitting: Emergency Medicine

## 2013-03-01 LAB — CBC
HCT: 43.3 % (ref 40.0–52.0)
MCH: 21.1 pg — ABNORMAL LOW (ref 26.0–34.0)
MCHC: 32 g/dL (ref 32.0–36.0)
MCV: 66 fL — ABNORMAL LOW (ref 80–100)
RDW: 16.4 % — ABNORMAL HIGH (ref 11.5–14.5)
WBC: 7.2 10*3/uL (ref 3.8–10.6)

## 2013-03-01 LAB — BASIC METABOLIC PANEL
Anion Gap: 8 (ref 7–16)
BUN: 13 mg/dL (ref 7–18)
Calcium, Total: 9.7 mg/dL (ref 8.5–10.1)
Chloride: 104 mmol/L (ref 98–107)
Co2: 25 mmol/L (ref 21–32)
Creatinine: 1.1 mg/dL (ref 0.60–1.30)
EGFR (Non-African Amer.): >60
Osmolality: 282 (ref 275–301)
Potassium: 4.4 mmol/L (ref 3.5–5.1)
Sodium: 137 mmol/L (ref 136–145)

## 2013-03-01 LAB — URINALYSIS, COMPLETE
Ketone: NEGATIVE
Ph: 5 (ref 4.5–8.0)
Protein: 75
WBC UR: <1 /HPF (ref 0–5)

## 2013-04-03 ENCOUNTER — Other Ambulatory Visit: Payer: Self-pay | Admitting: Internal Medicine

## 2013-04-03 DIAGNOSIS — E785 Hyperlipidemia, unspecified: Secondary | ICD-10-CM

## 2013-04-03 DIAGNOSIS — E119 Type 2 diabetes mellitus without complications: Secondary | ICD-10-CM

## 2013-04-03 NOTE — Telephone Encounter (Signed)
Spoke with pt regarding need for appointment. Pt scheduled a follow up appointment for 05/08/13, will not have insurance again until 05/03/13. Would you like him to have any lab work done prior that visit? I'll call and let him know.

## 2013-04-05 NOTE — Telephone Encounter (Signed)
Yes, he needs a CMET fasting lipids and a hgba1c ASAP to follow up on abnormal liver enzymes noted in February .

## 2013-04-06 NOTE — Telephone Encounter (Signed)
Lab appointment scheduled 

## 2013-05-01 ENCOUNTER — Other Ambulatory Visit: Payer: Self-pay | Admitting: Internal Medicine

## 2013-05-01 NOTE — Telephone Encounter (Signed)
SENT 30 DAY RX-NEEDS APPT FOR FURTHER REFILLS

## 2013-05-05 ENCOUNTER — Other Ambulatory Visit (INDEPENDENT_AMBULATORY_CARE_PROVIDER_SITE_OTHER): Payer: PRIVATE HEALTH INSURANCE

## 2013-05-05 DIAGNOSIS — E785 Hyperlipidemia, unspecified: Secondary | ICD-10-CM

## 2013-05-05 DIAGNOSIS — E119 Type 2 diabetes mellitus without complications: Secondary | ICD-10-CM

## 2013-05-05 LAB — LIPID PANEL
Cholesterol: 235 mg/dL — ABNORMAL HIGH (ref 0–200)
Triglycerides: 360 mg/dL — ABNORMAL HIGH (ref 0.0–149.0)
VLDL: 72 mg/dL — ABNORMAL HIGH (ref 0.0–40.0)

## 2013-05-05 LAB — COMPREHENSIVE METABOLIC PANEL
Albumin: 4.1 g/dL (ref 3.5–5.2)
Alkaline Phosphatase: 63 U/L (ref 39–117)
BUN: 24 mg/dL — ABNORMAL HIGH (ref 6–23)
CO2: 24 mEq/L (ref 19–32)
Calcium: 9.4 mg/dL (ref 8.4–10.5)
Chloride: 103 mEq/L (ref 96–112)
GFR: 50.98 mL/min — ABNORMAL LOW (ref >60.00)
Glucose, Bld: 141 mg/dL — ABNORMAL HIGH (ref 70–99)
Potassium: 4.4 mEq/L (ref 3.5–5.1)

## 2013-05-05 LAB — LDL CHOLESTEROL, DIRECT: Direct LDL: 142.4 mg/dL

## 2013-05-05 LAB — MICROALBUMIN / CREATININE URINE RATIO
Creatinine,U: 187.7 mg/dL
Microalb Creat Ratio: 2.3 mg/g (ref 0.0–30.0)
Microalb, Ur: 4.4 mg/dL — ABNORMAL HIGH (ref 0.0–1.9)

## 2013-05-08 ENCOUNTER — Encounter: Payer: Self-pay | Admitting: Internal Medicine

## 2013-05-08 ENCOUNTER — Ambulatory Visit (INDEPENDENT_AMBULATORY_CARE_PROVIDER_SITE_OTHER): Payer: BC Managed Care – PPO | Admitting: Internal Medicine

## 2013-05-08 VITALS — BP 128/70 | HR 98 | Temp 98.1°F | Wt 304.0 lb

## 2013-05-08 DIAGNOSIS — R0683 Snoring: Secondary | ICD-10-CM

## 2013-05-08 DIAGNOSIS — K760 Fatty (change of) liver, not elsewhere classified: Secondary | ICD-10-CM

## 2013-05-08 DIAGNOSIS — M549 Dorsalgia, unspecified: Secondary | ICD-10-CM

## 2013-05-08 DIAGNOSIS — R0609 Other forms of dyspnea: Secondary | ICD-10-CM

## 2013-05-08 DIAGNOSIS — E785 Hyperlipidemia, unspecified: Secondary | ICD-10-CM

## 2013-05-08 DIAGNOSIS — N058 Unspecified nephritic syndrome with other morphologic changes: Secondary | ICD-10-CM

## 2013-05-08 DIAGNOSIS — K7689 Other specified diseases of liver: Secondary | ICD-10-CM

## 2013-05-08 DIAGNOSIS — G4710 Hypersomnia, unspecified: Secondary | ICD-10-CM

## 2013-05-08 DIAGNOSIS — E1129 Type 2 diabetes mellitus with other diabetic kidney complication: Secondary | ICD-10-CM

## 2013-05-08 DIAGNOSIS — Z6841 Body Mass Index (BMI) 40.0 and over, adult: Secondary | ICD-10-CM

## 2013-05-08 MED ORDER — ROSUVASTATIN CALCIUM 10 MG PO TABS: 10.0000 mg | ORAL_TABLET | Freq: Every day | ORAL | Status: DC

## 2013-05-08 MED ORDER — HYDROCODONE-ACETAMINOPHEN 10-325 MG PO TABS: 1.0000 | ORAL_TABLET | Freq: Four times a day (QID) | ORAL | Status: DC | PRN

## 2013-05-08 MED ORDER — FENOFIBRATE 145 MG PO TABS: 145.0000 mg | ORAL_TABLET | Freq: Every day | ORAL | Status: DC

## 2013-05-08 NOTE — Assessment & Plan Note (Signed)
With undiagnosed sleep apnea, diabetes mellitus and hypertriglyceridemia.  Referral to bariatic surgery

## 2013-05-08 NOTE — Assessment & Plan Note (Signed)
With hypertriglyceridemia by recent  labs and fatty liver by prior abdominal ultrasound,  Spent 15 minutes discussing need for diet and exercise. Low glycemic index diet/Mediterranean diet recommended and minimum of 25 minutes of aerobic exercise recommended. Statin and fenofibrate advised.

## 2013-05-08 NOTE — Progress Notes (Signed)
Patient ID: ALEC JAROS, male   DOB: 12/28/1975, 37 y.o.   MRN: [id number]  Patient Active Problem List   Diagnosis Date Noted  . Fatty liver disease, nonalcoholic 05/08/2013  . Nephrolithiasis 08/24/2012  . Inguinal hernia 08/24/2012  . Morbid obesity with BMI of 40.0-44.9, adult 02/16/2012  . Rectal bleeding 02/16/2012  . Hyperlipidemia 05/11/2011  . DM (diabetes mellitus), type 2 with renal complications 05/11/2011    Subjective:  CC:   Chief Complaint  Patient presents with  . Follow-up    patient here for an checkup on diabetes-he is having lower back pain    HPI:   Kathryn Linarez Woodis a 37 y.o. male who presents for diabetes mellitus. H has been  lost to follow up due to lapse in insurance  Diffuse joint pain, including back,  Knees and  Legs feel very weak all the time.  No prior history of back pian until 2 weeks ago. Persisted despite stopping his softball two weeks ago.,  Aggravated by walking,  Sitting and bending over,  Relieved by laying down.  Pain radiates to left leg to mid thigh but not below.  Has been taking ibuprofen 400 mg  Every 12 hours., nothing else,  No other meds.   4 kidney stones in the last 7 months,   Er visits each time,  Has cut out soft drinks, tea,  dringing diet lemon aid and water only.   Diet soft drinks only once a week   Morbidly obese . Feels hungry 30 minutes after he eats a full breakfast of Eggs and bacon and toast .  Eats a huge bowel of Captain Crunch at night or else he wakes up at 3 am hyngry.  6 to 7  seven liquid stools daily for the past 7 months, with   Snores so bad he wakes the baby up. datyime somlolence,  sleeps in the car at lunch   Feet smelling bad   Foot exam done   No fungus    Past Medical History  Diagnosis Date  . Hypertension   . Hyperlipidemia   . Diabetes mellitus     borderline    Past Surgical History  Procedure Laterality Date  . Ankle surgery    . Ankle fracture surgery  2004       The following  portions of the patient's history were reviewed and updated as appropriate: Allergies, current medications, and problem list.    Review of Systems:   12 Pt  review of systems was negative except those addressed in the HPI,     History   Social History  . Marital Status: Single    Spouse Name: N/A    Number of Children: N/A  . Years of Education: N/A   Occupational History  . Not on file.   Social History Main Topics  . Smoking status: Never Smoker   . Smokeless tobacco: Never Used  . Alcohol Use: 0.6 oz/week    1 Cans of beer per week  . Drug Use: No  . Sexual Activity: Not on file   Other Topics Concern  . Not on file   Social History Narrative  . No narrative on file    Objective:  Filed Vitals:   05/08/13 1041  BP: 128/70  Pulse: 98  Temp: 98.1 F (36.7 C)     General appearance: alert, cooperative and appears stated age Ears: normal TM's and external ear canals both ears Throat: lips, mucosa, and tongue normal; teeth  and gums normal Neck: no adenopathy, no carotid bruit, supple, symmetrical, trachea midline and thyroid not enlarged, symmetric, no tenderness/mass/nodules Back: symmetric, no curvature. ROM normal. No CVA tenderness. Lungs: clear to auscultation bilaterally Heart: regular rate and rhythm, S1, S2 normal, no murmur, click, rub or gallop Abdomen: soft, non-tender; bowel sounds normal; no masses,  no organomegaly Pulses: 2+ and symmetric Skin: Skin color, texture, turgor normal. No rashes or lesions Lymph nodes: Cervical, supraclavicular, and axillary nodes normal.  Assessment and Plan:  DM (diabetes mellitus), type 2 with renal complications Stopping metformin,  Adding Januvia for a1c over 8.  Foot exam normal .  Adding statin ,    Morbid obesity with BMI of 40.0-44.9, adult With undiagnosed sleep apnea, diabetes mellitus and hypertriglyceridemia.  Referral to bariatic surgery   Fatty liver disease, nonalcoholic With  hypertriglyceridemia by recent  labs and fatty liver by prior abdominal ultrasound,  Spent 15 minutes discussing need for diet and exercise. Low glycemic index diet/Mediterranean diet recommended and minimum of 25 minutes of aerobic exercise recommended. Statin and fenofibrate advised.   Hyperlipidemia Crestor and fenofibrate prescribed today.   A total of 40 minutes was spent with patient more than half of which was spent in counseling, reviewing records from other prviders and coordination of care.  Updated Medication List Outpatient Encounter Prescriptions as of 05/08/2013  Medication Sig Dispense Refill  . aspirin 81 MG tablet Take 81 mg by mouth daily.        Marland Kitchen nystatin ointment (MYCOSTATIN) Apply topically 2 (two) times daily.  30 g  0  . telmisartan-hydrochlorothiazide (MICARDIS HCT) 80-12.5 MG per tablet Take 1 tablet by mouth daily. NEEDS APPT FOR REFILLS-PLEASE CALL OFFICE  30 tablet  4  . [DISCONTINUED] metFORMIN (GLUCOPHAGE) 1000 MG tablet TAKE 1 TABLET (1,000 MG TOTAL) BY MOUTH 2 (TWO) TIMES DAILY WITH A MEAL.  60 tablet  0  . [DISCONTINUED] simvastatin (ZOCOR) 40 MG tablet Take 1 tablet (40 mg total) by mouth at bedtime.  30 tablet  0  . fenofibrate (TRICOR) 145 MG tablet Take 1 tablet (145 mg total) by mouth daily.  30 tablet  5  . HYDROcodone-acetaminophen (NORCO) 10-325 MG per tablet Take 1 tablet by mouth every 6 (six) hours as needed for pain.  90 tablet  0  . nitroGLYCERIN (NITROSTAT) 0.4 MG SL tablet Place 1 tablet (0.4 mg total) under the tongue every 5 (five) minutes as needed for chest pain.  25 tablet  6  . rosuvastatin (CRESTOR) 10 MG tablet Take 1 tablet (10 mg total) by mouth daily.  30 tablet  3  . [DISCONTINUED] azithromycin (ZITHROMAX) 250 MG tablet Take 2 tablets today then take 1 tablet daily for the next 4 days.  6 tablet  0  . [DISCONTINUED] ciprofloxacin (CIPRO) 250 MG tablet Take 1 tablet (250 mg total) by mouth 2 (two) times daily.  14 tablet  0  .  [DISCONTINUED] HYDROcodone-acetaminophen (NORCO) 10-325 MG per tablet Take 1 tablet by mouth every 6 (six) hours as needed for pain.  30 tablet  0   No facility-administered encounter medications on file as of 05/08/2013.     Orders Placed This Encounter  Procedures  . DG Lumbar Spine Complete  . Ambulatory referral to General Surgery  . Nocturnal polysomnography (NPSG)    No Follow-up on file.

## 2013-05-08 NOTE — Assessment & Plan Note (Signed)
Stopping metformin,  Adding Januvia for a1c over 8.  Foot exam normal .  Adding statin ,

## 2013-05-08 NOTE — Patient Instructions (Addendum)
You r kidney function is down  Stop the ibuprofen and all products continue it,  Naproxen (aleve)  I HAVE PRESCRIBED VICODIN for your back pain.,  Up to 3 times daily Plain x rays ASAP at [facility] office.   Stop the metformin,  If the diarrhea clears up,  It was the metformin.   Your diabetes in uncontrolled:  I have given you samples of Januvia 100 mg .  Take one tablet daily with food,   Please check your blood sugars two times daily :  Fasting (early morning pre meal) ,  And 2 hr post meal (you can vary which meal) Records your blood sugars in the diary provided. If you have to eat cereal,  Eat GoLean Crunch.   You can buy it in bulk at [employer] ., and try using  almond milk or skim milk  With it.   Your weight is now > 300 lbs. Your triglycerides are almost 400  I am prescribing fenofibrate to help lower them, and the low GI diet.  I am changing your simvastatin to Crestor (samples given for 30 days ,  And rx for future refills)  I am referring you to a bariatric surgeon to discuss options for weight loss surgery . You need to follow a low glycemic index diet   This is  One version of a  "Low GI"  Diet:  It will still lower your blood sugars and your triglycerides and allow you to lose 4 to 8  lbs  per month if you follow it carefully.  Your goal with exercise is a minimum of 30 minutes of aerobic exercise 5 days per week (Walking does not count once it becomes easy!)    All of the foods can be found at grocery stores and in bulk at [employer].  The Atkins protein bars and shakes are available in more varieties at Target, WalMart and [employer].     7 AM Breakfast:  Choose from the following:  Low carbohydrate Protein  Shakes (I recommend the [employer]vant[employer] "Carb Control" shakes  Or the low carb shakes by Atkins.    2.5 carbs   Arnold's "Sandwhich Thin"toasted  w/ peanut butter (no jelly: about 20 net carbs  "Bagel Thin" with cream cheese and salmon: about 20 carbs   a  scrambled egg/bacon/cheese burrito made with Mission's "carb balance" whole wheat tortilla  (about 10 net carbs )   Avoid cereal and bananas, oatmeal and cream of wheat and grits. They are loaded with carbohydrates!   10 AM: high protein snack  Protein bar by Atkins (the snack size, under 200 cal, usually < 6 net carbs).    A stick of cheese:  Around 1 carb,  100 cal     Dannon Light n Fit [place] Yogurt  (80 cal, 8 carbs)  Other so called "protein bars" and Greek yogurts tend to be loaded with carbohydrates.  Remember, in food advertising, the word "energy" is synonymous for " carbohydrate."  Lunch:   A Sandwich using the bread choices listed, Can use any  Eggs,  lunchmeat, grilled meat or canned tuna), avocado, regular mayo/mustard  and cheese.  A Salad using blue cheese, ranch,  Goddess or vinagrette,  No croutons or "confetti" and no "candied nuts" but regular nuts OK.   No pretzels or chips.  Pickles and miniature sweet peppers are a good low carb alternative that provide a "crunch"  The bread is the only source of  carbohydrate in a sandwich and  can be decreased by trying some of these alternatives to traditional loaf bread  Joseph's makes a pita bread and a flat bread that are 50 cal and 4 net carbs available at BJs and [employer]Mart.  This can be toasted to use with hummous as well  Toufayan makes a low carb flatbread that's 100 cal and 9 net carbs available at [employer] and [employer] makes 2 sizes of  Low carb whole wheat tortilla  (The large one is 210 cal and 6 net carbs) Avoid "Low fat dressings, as well as Reyne Dumas and [address] dressings They are loaded with sugar!   3 PM/ Mid day  Snack:  Consider  1 ounce of  almonds, walnuts, pistachios, pecans, peanuts,  Macadamia nuts or a nut medley.  Avoid "granola"; the dried cranberries and raisins are loaded with carbohydrates. Mixed nuts as long as there are no raisins,  cranberries or dried fruit.     6 PM  Dinner:      Meat/fowl/fish with a green salad, and either broccoli, cauliflower, green beans, spinach, brussel sprouts or  Lima beans. DO NOT BREAD THE PROTEIN!!      There is a low carb pasta by Dreamfield's that is acceptable and tastes great: only 5 digestible carbs/serving.( All grocery stores but BJs carry it )  Try Kai Levins Angelo's chicken piccata or chicken or eggplant parm over low carb pasta.(Lowes and BJs)   Clifton Custard Sanchez's "Carnitas" (pulled pork, no sauce,  0 carbs) or his beef pot roast to make a dinner burrito (at [employer]J's)  Pesto over low carb pasta (bj's sells a good quality pesto in the center refrigerated section of the deli   Whole wheat pasta is still full of digestible carbs and  Not as low in glycemic index as Dreamfield's.   Brown rice is still rice,  So skip the rice and noodles if you eat [place] or [place] (or at least limit to 1/2 cup)  9 PM snack :   Breyer's "low carb" fudgsicle or  ice cream bar (Carb Smart line), or  Weight Watcher's ice cream bar , or another "no sugar added" ice cream;  a serving of fresh berries/cherries with whipped cream   Cheese or DANNON'S LlGHT [employer] FIT [place] YOGURT  Avoid bananas, pineapple, grapes  and watermelon on a regular basis because they are high in sugar.  THINK OF THEM AS DESSERT  Remember that snack Substitutions should be less than 10 NET carbs per serving and meals < 20 carbs. Remember to subtract fiber grams to get the "net carbs."

## 2013-05-08 NOTE — Assessment & Plan Note (Signed)
Crestor and fenofibrate prescribed today.

## 2013-05-16 ENCOUNTER — Encounter: Payer: Self-pay | Admitting: Emergency Medicine

## 2013-05-23 ENCOUNTER — Ambulatory Visit: Payer: BC Managed Care – PPO | Admitting: Internal Medicine

## 2013-05-30 ENCOUNTER — Ambulatory Visit (INDEPENDENT_AMBULATORY_CARE_PROVIDER_SITE_OTHER): Payer: BC Managed Care – PPO | Admitting: Internal Medicine

## 2013-05-30 ENCOUNTER — Encounter: Payer: Self-pay | Admitting: Internal Medicine

## 2013-05-30 VITALS — BP 118/76 | HR 97 | Temp 98.7°F | Resp 12 | Ht 69.6 in | Wt 297.0 lb

## 2013-05-30 DIAGNOSIS — N179 Acute kidney failure, unspecified: Secondary | ICD-10-CM | POA: Insufficient documentation

## 2013-05-30 DIAGNOSIS — E1129 Type 2 diabetes mellitus with other diabetic kidney complication: Secondary | ICD-10-CM

## 2013-05-30 DIAGNOSIS — N058 Unspecified nephritic syndrome with other morphologic changes: Secondary | ICD-10-CM

## 2013-05-30 DIAGNOSIS — K760 Fatty (change of) liver, not elsewhere classified: Secondary | ICD-10-CM

## 2013-05-30 DIAGNOSIS — Z23 Encounter for immunization: Secondary | ICD-10-CM

## 2013-05-30 DIAGNOSIS — K7689 Other specified diseases of liver: Secondary | ICD-10-CM

## 2013-05-30 DIAGNOSIS — Z6841 Body Mass Index (BMI) 40.0 and over, adult: Secondary | ICD-10-CM

## 2013-05-30 LAB — COMPREHENSIVE METABOLIC PANEL
Albumin: 4.3 g/dL (ref 3.5–5.2)
BUN: 21 mg/dL (ref 6–23)
CO2: 26 mEq/L (ref 19–32)
Calcium: 9.6 mg/dL (ref 8.4–10.5)
Chloride: 104 mEq/L (ref 96–112)
GFR: 60.67 mL/min (ref >60.00)
Glucose, Bld: 84 mg/dL (ref 70–99)
Sodium: 138 mEq/L (ref 135–145)
Total Bilirubin: 0.6 mg/dL (ref 0.3–1.2)
Total Protein: 7.5 g/dL (ref 6.0–8.3)

## 2013-05-30 NOTE — Assessment & Plan Note (Addendum)
hemoglobin A1c is elevated at 8.1.  H e is up-to-date on eye exams and his foot exam is normal.  He has minimal proteinuria by today's urine test.  Asked to check post prandials for the next two weeks to determine if he needs to start [place].

## 2013-05-30 NOTE — Patient Instructions (Signed)
Congratulations on the weight loss !!  You are doing great.    I have repeated your liver and kidney function today. Do not resume the micardis (blood pressure medication)  Unless I call you.  Acontinue to avoid advil and motrin for now.   You do not need to start the fenofibrate for your triglycerides.  The diet and the crestor should improve them.      Your fasting sugars are normal.    Please start checking your sugars 2 hours after meals (once daily,  Pick a different meal daily) and send the log to me every 2 weeks via Mychart if preferred.   Return for fasting labs on or after Jan 17th and follow up with me after that

## 2013-05-30 NOTE — Assessment & Plan Note (Signed)
Improving with dietary changes.  Referral to bariatric center in process.

## 2013-05-30 NOTE — Assessment & Plan Note (Signed)
Slight improvement today.  Will continue to hold the hctz and micardis.

## 2013-05-30 NOTE — Assessment & Plan Note (Signed)
Liver enzymes are improving with weight loss, rosuvastatin  and dietary changes.

## 2013-05-30 NOTE — Progress Notes (Signed)
Pre-visit discussion using our clinic review tool. No additional management support is needed unless otherwise documented below in the visit note.  

## 2013-05-30 NOTE — Progress Notes (Signed)
Patient ID: VADHIR MCNAY, male   DOB: January 30, 1976, 37 y.o.   MRN: [id number]  Patient Active Problem List   Diagnosis Date Noted  . Acute renal failure 05/30/2013  . Fatty liver disease, nonalcoholic 05/08/2013  . Nephrolithiasis 08/24/2012  . Inguinal hernia 08/24/2012  . Morbid obesity with BMI of 40.0-44.9, adult 02/16/2012  . Rectal bleeding 02/16/2012  . Hyperlipidemia 05/11/2011  . DM (diabetes mellitus), type 2 with renal complications 05/11/2011    Subjective:  CC:   Chief Complaint  Patient presents with  . Follow-up    HPI:   Rocco Kerkhoff Woodis a 37 y.o. male who presents 3 week follow up on morbid obesity, uncontrolled diabetes with renal manifestations, and elevated liver enzymes.  He has lost 7 lbs in 2 weeks. Has participated in a 24 day challenge from a client selling "[employer]" nutritional program and eating whole grains,  No sodas,   Cut back on starches.   DM:  highest blood sugars was 197 1 hour post prandially ,  fastings have been less than 120 most of the time.  Did not start the [place].  bc he wanted to have the dietary changes.   Sleep study is scheduled for  this Thursday  Referral to bariatric center is under way  3 week follow up on morbid obesity, uncontrolled diabetes with renal manifestations, and elevated liver enzymes.  He has lost 7 lbs in 2 weeks. Has participated in a 24 day challenge from a client selling "[employer]" nutritional program and eating whole grains,  No sodas,   Cut back on starches.   DM:  highest blood sugars was 197 1 hour post prandially ,  fastings have been less than 120 most of the time.  Did not start the [place].  bc he wanted to have the dietary changes.   Sleep study is scheduled for  this Thursday  Referral to bariatric center is under way    Past Medical History  Diagnosis Date  . Hypertension   . Hyperlipidemia   . Diabetes mellitus     borderline    Past Surgical History  Procedure Laterality Date  . Ankle  surgery    . Ankle fracture surgery  2004       The following portions of the patient's history were reviewed and updated as appropriate: Allergies, current medications, and problem list.    Review of Systems:   12 Pt  review of systems was negative except those addressed in the HPI,     History   Social History  . Marital Status: Single    Spouse Name: N/A    Number of Children: N/A  . Years of Education: N/A   Occupational History  . Not on file.   Social History Main Topics  . Smoking status: Never Smoker   . Smokeless tobacco: Never Used  . Alcohol Use: 0.6 oz/week    1 Cans of beer per week  . Drug Use: No  . Sexual Activity: Not on file   Other Topics Concern  . Not on file   Social History Narrative  . No narrative on file    Objective:  Filed Vitals:   05/30/13 1126  BP: 118/76  Pulse: 97  Temp: 98.7 F (37.1 C)  Resp: 12     General appearance: alert, cooperative and appears stated age Ears: normal TM's and external ear canals both ears Throat: lips, mucosa, and tongue normal; teeth and gums normal Neck: no adenopathy, no carotid bruit,  supple, symmetrical, trachea midline and thyroid not enlarged, symmetric, no tenderness/mass/nodules Back: symmetric, no curvature. ROM normal. No CVA tenderness. Lungs: clear to auscultation bilaterally Heart: regular rate and rhythm, S1, S2 normal, no murmur, click, rub or gallop Abdomen: soft, non-tender; bowel sounds normal; no masses,  no organomegaly Pulses: 2+ and symmetric Skin: Skin color, texture, turgor normal. No rashes or lesions Lymph nodes: Cervical, supraclavicular, and axillary nodes normal.  Assessment and Plan:  Acute renal failure Slight improvement today.  Will continue to hold the hctz and micardis.   DM (diabetes mellitus), type 2 with renal complications hemoglobin A1c is elevated at 8.1.  H e is up-to-date on eye exams and his foot exam is normal.  He has minimal proteinuria by  today's urine test.  Asked to check post prandials for the next two weeks to determine if he needs to start [place].   Fatty liver disease, nonalcoholic Liver enzymes are improving with weight loss, rosuvastatin  and dietary changes.    Morbid obesity with BMI of 40.0-44.9, adult Improving with dietary changes.  Referral to bariatric center in process.  A total of 40 minutes was spent with patient more than half of which was spent in counseling, reviewing records from other prviders and coordination of care.  Updated Medication List Outpatient Encounter Prescriptions as of 05/30/2013  Medication Sig  . HYDROcodone-acetaminophen (NORCO) 10-325 MG per tablet Take 1 tablet by mouth every 6 (six) hours as needed for pain.  Marland Kitchen aspirin 81 MG tablet Take 81 mg by mouth daily.    . nitroGLYCERIN (NITROSTAT) 0.4 MG SL tablet Place 1 tablet (0.4 mg total) under the tongue every 5 (five) minutes as needed for chest pain.  Marland Kitchen nystatin ointment (MYCOSTATIN) Apply topically 2 (two) times daily.  . rosuvastatin (CRESTOR) 10 MG tablet Take 1 tablet (10 mg total) by mouth daily.  . [DISCONTINUED] fenofibrate (TRICOR) 145 MG tablet Take 1 tablet (145 mg total) by mouth daily.  . [DISCONTINUED] telmisartan-hydrochlorothiazide (MICARDIS HCT) 80-12.5 MG per tablet Take 1 tablet by mouth daily. NEEDS APPT FOR REFILLS-PLEASE CALL OFFICE     Orders Placed This Encounter  Procedures  . Pneumococcal polysaccharide vaccine 23-valent greater than or equal to 2yo subcutaneous/IM  . Comp Met (CMET)  . HM DIABETES EYE EXAM    No Follow-up on file.

## 2013-06-01 ENCOUNTER — Ambulatory Visit: Payer: Self-pay | Admitting: Internal Medicine

## 2013-06-09 ENCOUNTER — Telehealth: Payer: Self-pay | Admitting: Internal Medicine

## 2013-06-09 DIAGNOSIS — G4733 Obstructive sleep apnea (adult) (pediatric): Secondary | ICD-10-CM | POA: Insufficient documentation

## 2013-06-09 NOTE — Telephone Encounter (Signed)
Tried to reach patient no voicemail

## 2013-06-09 NOTE — Telephone Encounter (Signed)
CPAP mask will be ordered today for heis newly diagnosed sleep apnea.

## 2013-06-19 ENCOUNTER — Telehealth: Payer: Self-pay | Admitting: Internal Medicine

## 2013-06-19 MED ORDER — HYDROCODONE-ACETAMINOPHEN 10-325 MG PO TABS: 1.0000 | ORAL_TABLET | Freq: Four times a day (QID) | ORAL | Status: DC | PRN

## 2013-06-19 NOTE — Telephone Encounter (Signed)
Patient would like to get refill on Hydrocodone last fill 10 /20/14 states knees still hurt and ask if sleep study results are in.

## 2013-06-19 NOTE — Telephone Encounter (Signed)
Pt is needing refill on Hyrdocodone. Please ask Alycia Rossetti that is his middle name and what he goes by. Pt would like to know results of sleep study.

## 2013-06-19 NOTE — Telephone Encounter (Signed)
When  I have the results of the sleep study I will call him.  If there is no message in the chart, then i have not received it yet.   Refill on HC on printer

## 2013-06-20 ENCOUNTER — Encounter: Payer: Self-pay | Admitting: *Deleted

## 2013-06-20 NOTE — Telephone Encounter (Signed)
Patient notified to pick up med and will call with results when received.

## 2013-07-17 ENCOUNTER — Encounter: Payer: Self-pay | Admitting: Internal Medicine

## 2013-07-26 ENCOUNTER — Telehealth: Payer: Self-pay | Admitting: *Deleted

## 2013-07-26 MED ORDER — HYDROCODONE-ACETAMINOPHEN 10-325 MG PO TABS: 1.0000 | ORAL_TABLET | Freq: Four times a day (QID) | ORAL | Status: DC | PRN

## 2013-07-26 NOTE — Telephone Encounter (Signed)
Patient notified to pick up script in narcotic box.

## 2013-07-26 NOTE — Telephone Encounter (Signed)
Ok to refill,  printed rx  And signed and in box

## 2013-07-26 NOTE — Telephone Encounter (Signed)
Patient needs refill on Hydrocodone 10/325 please advise ok to fill. Last fill 06/19/13

## 2013-08-29 ENCOUNTER — Encounter: Payer: Self-pay | Admitting: Internal Medicine

## 2013-08-30 ENCOUNTER — Other Ambulatory Visit: Payer: Self-pay | Admitting: Adult Health

## 2013-08-30 ENCOUNTER — Telehealth: Payer: Self-pay | Admitting: *Deleted

## 2013-08-30 MED ORDER — HYDROCODONE-ACETAMINOPHEN 10-325 MG PO TABS: 1.0000 | ORAL_TABLET | Freq: Four times a day (QID) | ORAL | Status: DC | PRN

## 2013-08-30 NOTE — Telephone Encounter (Signed)
Patient picked up script

## 2013-08-30 NOTE — Telephone Encounter (Signed)
Ok to refill,  printed rx  

## 2013-08-30 NOTE — Telephone Encounter (Signed)
Patient is having increased back pain and frequency in urination patient cannot come in but have scheduled acute visit for 08/31/13 at 9.15 patient ask if his pain medication could be filled and have wife pick up script.

## 2013-08-31 ENCOUNTER — Encounter: Payer: Self-pay | Admitting: Adult Health

## 2013-08-31 ENCOUNTER — Ambulatory Visit (INDEPENDENT_AMBULATORY_CARE_PROVIDER_SITE_OTHER): Payer: PRIVATE HEALTH INSURANCE | Admitting: Adult Health

## 2013-08-31 ENCOUNTER — Encounter (INDEPENDENT_AMBULATORY_CARE_PROVIDER_SITE_OTHER): Payer: Self-pay

## 2013-08-31 ENCOUNTER — Ambulatory Visit: Payer: BC Managed Care – PPO | Admitting: Internal Medicine

## 2013-08-31 VITALS — BP 130/58 | HR 118 | Temp 98.5°F | Resp 14 | Wt 285.0 lb

## 2013-08-31 DIAGNOSIS — R350 Frequency of micturition: Secondary | ICD-10-CM | POA: Insufficient documentation

## 2013-08-31 DIAGNOSIS — N390 Urinary tract infection, site not specified: Secondary | ICD-10-CM

## 2013-08-31 DIAGNOSIS — B379 Candidiasis, unspecified: Secondary | ICD-10-CM

## 2013-08-31 LAB — POCT URINALYSIS DIPSTICK
Bilirubin, UA: NEGATIVE
Glucose, UA: >=1000
LEUKOCYTES UA: NEGATIVE
NITRITE UA: NEGATIVE
PH UA: 5
Protein, UA: 100
Spec Grav, UA: 1.015
Urobilinogen, UA: 0.2

## 2013-08-31 MED ORDER — LEVOFLOXACIN 500 MG PO TABS: ORAL_TABLET | ORAL | Status: DC

## 2013-08-31 MED ORDER — NYSTATIN-TRIAMCINOLONE 100000-0.1 UNIT/GM-% EX OINT: 1.0000 "application " | TOPICAL_OINTMENT | Freq: Two times a day (BID) | CUTANEOUS | Status: AC

## 2013-08-31 MED ORDER — ROSUVASTATIN CALCIUM 10 MG PO TABS: 10.0000 mg | ORAL_TABLET | Freq: Every day | ORAL | Status: AC

## 2013-08-31 NOTE — Progress Notes (Signed)
Patient ID: Nathaniel Flores, male   DOB: 12/19/1975, 38 y.o.   MRN: [id number]    Subjective:    Patient ID: Nathaniel Flores, male    DOB: 12/03/1975, 38 y.o.   MRN: [id number]  HPI  Pt is a 38 y/o male with hx of DM, HTN, HLD who presents to clinic with back pain and urinary frequency. Symptoms started ~ 1 week. He reports low back discomfort as well. He is uncertain if some of his discomfort is coming from picking up his 36 year old child. Hx of kidney stones. Denies hematuria.   Pt also has yeast of the axilla. Reports improvement with nystatin but then returns. Hx of diabetes.   Past Medical History  Diagnosis Date  . Hypertension   . Hyperlipidemia   . Diabetes mellitus     borderline    Current Outpatient Prescriptions on File Prior to Visit  Medication Sig Dispense Refill  . aspirin 81 MG tablet Take 81 mg by mouth daily.        Marland Kitchen. HYDROcodone-acetaminophen (NORCO) 10-325 MG per tablet Take 1 tablet by mouth every 6 (six) hours as needed.  90 tablet  0  . nitroGLYCERIN (NITROSTAT) 0.4 MG SL tablet Place 1 tablet (0.4 mg total) under the tongue every 5 (five) minutes as needed for chest pain.  25 tablet  6  . nystatin ointment (MYCOSTATIN) Apply topically 2 (two) times daily.  30 g  0   No current facility-administered medications on file prior to visit.     Review of Systems  Constitutional: Negative.   Respiratory: Negative.   Cardiovascular: Negative.   Genitourinary: Positive for frequency. Negative for dysuria, urgency and hematuria.  Musculoskeletal: Positive for back pain.  Neurological: Negative.   Psychiatric/Behavioral: Negative.        Objective:  BP 130/58  Pulse 118  Temp(Src) 98.5 F (36.9 C) (Oral)  Resp 14  Wt 285 lb (129.275 kg)  SpO2 93%   Physical Exam  Constitutional: He is oriented to person, place, and time. No distress.  Cardiovascular: Normal rate and regular rhythm.   Pulmonary/Chest: Effort normal. No respiratory distress.  Musculoskeletal:  Normal range of motion.  Low back discomfort  Neurological: He is alert and oriented to person, place, and time.  Skin:  Yeast underneath both arms  Psychiatric: He has a normal mood and affect. His behavior is normal. Judgment and thought content normal.       Assessment & Plan:   1. UTI (urinary tract infection) UA showing some blood. ?UTI vs. Kidney stone irritating. Send urine for urinalysis and culture. Start Levaquin.  - POCT urinalysis dipstick  2. Frequency of urination See above  - Urinalysis, Routine w reflex microscopic - Urine Culture  3. Yeast infection Start mycolog bid. When area heals, use cornstarch powder to keep area dry. Use deoderant that fully dries and does not stay moist.

## 2013-08-31 NOTE — Patient Instructions (Signed)
  I am sending the urine sample for a culture. We will call you with results once they are available  Start Levaquin 500 mg daily for 7 days.  Apply mycolog cream to your underarms twice a day. When the area has healed then use a deodorant that completely dries and apply cornstarch powder to the area.

## 2013-09-04 LAB — URINE CULTURE: Colony Count: 30000

## 2013-09-06 ENCOUNTER — Ambulatory Visit (INDEPENDENT_AMBULATORY_CARE_PROVIDER_SITE_OTHER): Payer: PRIVATE HEALTH INSURANCE | Admitting: Internal Medicine

## 2013-09-06 ENCOUNTER — Encounter: Payer: Self-pay | Admitting: Internal Medicine

## 2013-09-06 ENCOUNTER — Telehealth: Payer: Self-pay | Admitting: Internal Medicine

## 2013-09-06 VITALS — BP 132/82 | HR 115 | Temp 98.6°F | Resp 16 | Wt 277.0 lb

## 2013-09-06 DIAGNOSIS — Z91119 Patient's noncompliance with dietary regimen due to unspecified reason: Secondary | ICD-10-CM

## 2013-09-06 DIAGNOSIS — E119 Type 2 diabetes mellitus without complications: Secondary | ICD-10-CM

## 2013-09-06 DIAGNOSIS — E1100 Type 2 diabetes mellitus with hyperosmolarity without nonketotic hyperglycemic-hyperosmolar coma (NKHHC): Secondary | ICD-10-CM

## 2013-09-06 DIAGNOSIS — E1129 Type 2 diabetes mellitus with other diabetic kidney complication: Secondary | ICD-10-CM

## 2013-09-06 DIAGNOSIS — E1310 Other specified diabetes mellitus with ketoacidosis without coma: Secondary | ICD-10-CM

## 2013-09-06 DIAGNOSIS — Z91199 Patient's noncompliance with other medical treatment and regimen due to unspecified reason: Secondary | ICD-10-CM

## 2013-09-06 DIAGNOSIS — E1165 Type 2 diabetes mellitus with hyperglycemia: Secondary | ICD-10-CM

## 2013-09-06 DIAGNOSIS — Z9119 Patient's noncompliance with other medical treatment and regimen: Secondary | ICD-10-CM

## 2013-09-06 DIAGNOSIS — Z9111 Patient's noncompliance with dietary regimen: Secondary | ICD-10-CM

## 2013-09-06 DIAGNOSIS — E1110 Type 2 diabetes mellitus with ketoacidosis without coma: Secondary | ICD-10-CM

## 2013-09-06 LAB — GLUCOSE, POCT (MANUAL RESULT ENTRY)

## 2013-09-06 MED ORDER — INSULIN LISPRO PROT & LISPRO (75-25 MIX) 100 UNIT/ML KWIKPEN: PEN_INJECTOR | SUBCUTANEOUS | Status: DC

## 2013-09-06 MED ORDER — SITAGLIPTIN PHOSPHATE 100 MG PO TABS: 100.0000 mg | ORAL_TABLET | Freq: Every day | ORAL | Status: AC

## 2013-09-06 NOTE — Telephone Encounter (Signed)
FYI next patient ,

## 2013-09-06 NOTE — Telephone Encounter (Signed)
Patient Information:  Caller Name: Nathaniel Flores  Phone: [phone]  Patient: Nathaniel Flores, Nathaniel Flores  Gender: Male  DOB: 11/05/1975  Age: 38 Years  PCP: Duncan Dullullo, Teresa (Adults only)  Office Follow Up:  Does the office need to follow up with this patient?: No  Instructions For The Office: N/A  RN Note:  Appt. in the office for this pt. at 14:45 today to obtain a Blood Sugar. He has lost his meter and is having increased thirst and urination.  Symptoms  Reason For Call & Symptoms: Nathaniel FlesherWent to the Dr. last week on 08/30/13. C/o frequency. Urinating 10-12 x a day. Drinking a lot a fluid. Was treated for a UTI. Did not check any bloodwork for Diabetes. Having leg cramps. Drinking Gatorade and Pedialyte to help with the leg cramps. Advised those drinks have a lot of sugar in them. Also c/o fatigue.  Reviewed Health History In EMR: Yes  Reviewed Medications In EMR: Yes  Reviewed Allergies In EMR: Yes  Reviewed Surgeries / Procedures: Yes  Date of Onset of Symptoms: 08/30/2013  Guideline(s) Used:  Diabetes - High Blood Sugar  Disposition Per Guideline:   Go to ED Now (or to Office with PCP Approval)  Reason For Disposition Reached:   Patient sounds very sick or weak to the triager  Advice Given:  Call Back If:  Rapid breathing occurs  You become worse.  Patient Will Follow Care Advice:  YES  Appointment Scheduled:  09/06/2013 14:45:00 Appointment Scheduled Provider:  Duncan Dullullo, Teresa (Adults only)

## 2013-09-06 NOTE — Progress Notes (Signed)
Patient ID: Nathaniel Flores, male   DOB: 04-22-1976, 37 y.o.   MRN: [id number]  Patient Active Problem List   Diagnosis Date Noted  . Yeast infection 08/31/2013  . Frequency of urination 08/31/2013  . OSA (obstructive sleep apnea) 06/09/2013  . Acute renal failure 05/30/2013  . Fatty liver disease, nonalcoholic 05/08/2013  . Nephrolithiasis 08/24/2012  . Inguinal hernia 08/24/2012  . Morbid obesity with BMI of 40.0-44.9, adult 02/16/2012  . Rectal bleeding 02/16/2012  . Hyperlipidemia 05/11/2011  . Type II or unspecified type diabetes mellitus with renal manifestations, uncontrolled 05/11/2011    Subjective:  CC:   Chief Complaint  Patient presents with  . Acute Visit    urinary frequency  . Diabetes    HPI:   Nathaniel Flores is a 38 y.o. male who presents for  2 Wk historyof fatigue and malaise accompanied by polyuria,nocturia rapid wt loss and muscle cramps.  . Saw RR a week ago and was treated empirically for UTI.  Review of UA notes only glucosuria and urine culture was negative . He has not been checking his blood sugars for the last several weeks because it has  Been packed up in a box for his house move.  He reports dietary noncompliance for the past two months with daily consumption of cookies, cakes and junk food.    Past Medical History  Diagnosis Date  . Hypertension   . Hyperlipidemia   . Diabetes mellitus     borderline    Past Surgical History  Procedure Laterality Date  . Ankle surgery    . Ankle fracture surgery  2004       The following portions of the patient's history were reviewed and updated as appropriate: Allergies, current medications, and problem list.    Review of Systems:   Patient denies headache, fevers, malaise, unintentional weight loss, skin rash, eye pain, sinus congestion and sinus pain, sore throat, dysphagia,  hemoptysis , cough, dyspnea, wheezing, chest pain, palpitations, orthopnea, edema, abdominal pain, nausea, melena, diarrhea,  constipation, flank pain, dysuria, hematuria, urinary  Frequency, nocturia, numbness, tingling, seizures,  Focal weakness, Loss of consciousness,  Tremor, insomnia, depression, anxiety, and suicidal ideation.     History   Social History  . Marital Status: Single    Spouse Name: N/A    Number of Children: N/A  . Years of Education: N/A   Occupational History  . Not on file.   Social History Main Topics  . Smoking status: Never Smoker   . Smokeless tobacco: Never Used  . Alcohol Use: 0.6 oz/week    1 Cans of beer per week  . Drug Use: No  . Sexual Activity: Yes   Other Topics Concern  . Not on file   Social History Narrative  . No narrative on file    Objective:  Filed Vitals:   09/06/13 1444  BP: 132/82  Pulse: 115  Temp: 98.6 F (37 C)  Resp: 16     General appearance: alert, cooperative and appears stated age Ears: normal TM's and external ear canals both ears Throat: lips, mucosa, and tongue normal; teeth and gums normal Neck: no adenopathy, no carotid bruit, supple, symmetrical, trachea midline and thyroid not enlarged, symmetric, no tenderness/mass/nodules Back: symmetric, no curvature. ROM normal. No CVA tenderness. Lungs: clear to auscultation bilaterally Heart: regular rate and rhythm, S1, S2 normal, no murmur, click, rub or gallop Abdomen: soft, non-tender; bowel sounds normal; no masses,  no organomegaly Pulses: 2+ and symmetric Skin:  Skin color, texture, turgor normal. No rashes or lesions Lymph nodes: Cervical, supraclavicular, and axillary nodes normal.  Assessment and Plan:  Type II or unspecified type diabetes mellitus with renal manifestations, uncontrolled Complete loss of control in the last 3 months Secondary to dietary and medication noncompliance.  Starting mixed insulin and [place].  Low GI diet advised and [facility] Lab Results  Component Value Date   HGBA1C 12.6* 09/06/2013    Dietary  noncompliance Not following a sensible diet   Long discussion about diabetes and diet.  [facility]erral [facility].    Updated Medication List Outpatient Encounter Prescriptions as of 09/06/2013  Medication Sig  . aspirin 81 MG tablet Take 81 mg by mouth daily.    Marland Kitchen. HYDROcodone-acetaminophen (NORCO) 10-325 MG per tablet Take 1 tablet by mouth every 6 (six) hours as needed.  Marland Kitchen. levofloxacin (LEVAQUIN) 500 MG tablet Take 1 tablet daily for 7 days.  Marland Kitchen. nystatin-triamcinolone ointment (MYCOLOG) Apply 1 application topically 2 (two) times daily.  . rosuvastatin (CRESTOR) 10 MG tablet Take 1 tablet (10 mg total) by mouth daily.  . Insulin Lispro Prot & Lispro (HUMALOG MIX 75/25 KWIKPEN) (75-25) 100 UNIT/ML Kwikpen 15 units before breakfast and 25 units before dinner  . nitroGLYCERIN (NITROSTAT) 0.4 MG SL tablet Place 1 tablet (0.4 mg total) under the tongue every 5 (five) minutes as needed for chest pain.  . sitaGLIPtin (JANUVIA) 100 MG tablet Take 1 tablet (100 mg total) by mouth daily.

## 2013-09-06 NOTE — Patient Instructions (Addendum)
Your diabetes is under poor control on current regimen.  I am starting you on Januvia 100 mg  One tablet daily  (this is an oral medication that will take a while to work)   and twice daily insulin shots   I am starting you on A mixed insulin HUMALOG 75/25)  to take twice daily,  Before breakfast and evening meal  15 units in the morning and 25 units in the evening (BEFORE MEALS) .  This has 2 types of insulin that will cover meal time and the following 6 hours.   Start checking your sugars twice daily:   I am interested in fastings and 2 hour post prandials (after a meal)along with a brief description of the meal content related to the particular blood sugars.   SAVE THE NOVOLOG FLEX PEN TO use only for POST PRANDIAL blood sugars over 350 (give yourself 10 units of NOVOLOG IN THAT CASE)   RETURN IN ONE WEEK WITH A LOG OF YOUR BLOOD SUGARS  I AM REFERRING YOU TO [facility] EDUCATION [facility]   You need to get back on a low glycemic index diet  Type 2 Diabetes Mellitus, Adult Type 2 diabetes mellitus, often simply referred to as type 2 diabetes, is a long-lasting (chronic) disease. In type 2 diabetes, the pancreas does not make enough insulin (a hormone), the cells are less responsive to the insulin that is made (insulin resistance), or both. Normally, insulin moves sugars from food into the tissue cells. The tissue cells use the sugars for energy. The lack of insulin or the lack of normal response to insulin causes excess sugars to build up in the blood instead of going into the tissue cells. As a result, high blood sugar (hyperglycemia) develops. The effect of high sugar (glucose) levels can cause many complications. Type 2 diabetes was also previously called adult-onset diabetes but it can occur at any age.  RISK FACTORS  A person is predisposed to developing type 2 diabetes if someone in the family has the disease and also has one or more of the following primary risk  factors:  Overweight.  An inactive lifestyle.  A history of consistently eating high-calorie foods. Maintaining a normal weight and regular physical activity can reduce the chance of developing type 2 diabetes. SYMPTOMS  A person with type 2 diabetes may not show symptoms initially. The symptoms of type 2 diabetes appear slowly. The symptoms include:  Increased thirst (polydipsia).  Increased urination (polyuria).  Increased urination during the night (nocturia).  Weight loss. This weight loss may be rapid.  Frequent, recurring infections.  Tiredness (fatigue).  Weakness.  Vision changes, such as blurred vision.  Fruity smell to your breath.  Abdominal pain.  Nausea or vomiting.  Cuts or bruises which are slow to heal.  Tingling or numbness in the hands or feet. DIAGNOSIS Type 2 diabetes is frequently not diagnosed until complications of diabetes are present. Type 2 diabetes is diagnosed when symptoms or complications are present and when blood glucose levels are increased. Your blood glucose level may be checked by one or more of the following blood tests:  A fasting blood glucose test. You will not be allowed to eat for at least 8 hours before a blood sample is taken.  A random blood glucose test. Your blood glucose is checked at any time of the day regardless of when you ate.  A hemoglobin A1c blood glucose test. A hemoglobin A1c test provides information about blood glucose control  over the previous 3 months.  An oral glucose tolerance test (OGTT). Your blood glucose is measured after you have not eaten (fasted) for 2 hours and then after you drink a glucose-containing beverage. TREATMENT   You may need to take insulin or diabetes medicine daily to keep blood glucose levels in the desired range.  You will need to match insulin dosing with exercise and healthy food choices. The treatment goal is to maintain the before meal blood sugar (preprandial glucose) level  at 70 130 mg/dL. HOME CARE INSTRUCTIONS   Have your hemoglobin A1c level checked twice a year.  Perform daily blood glucose monitoring as directed by your caregiver.  Monitor urine ketones when you are ill and as directed by your caregiver.  Take your diabetes medicine or insulin as directed by your caregiver to maintain your blood glucose levels in the desired range.  Never run out of diabetes medicine or insulin. It is needed every day.  Adjust insulin based on your intake of carbohydrates. Carbohydrates can raise blood glucose levels but need to be included in your diet. Carbohydrates provide vitamins, minerals, and fiber which are an essential part of a healthy diet. Carbohydrates are found in fruits, vegetables, whole grains, dairy products, legumes, and foods containing added sugars.    Eat healthy foods. Alternate 3 meals with 3 snacks.  Lose weight if overweight.  Carry a medical alert card or wear your medical alert jewelry.  Carry a 15 gram carbohydrate snack with you at all times to treat low blood glucose (hypoglycemia). Some examples of 15 gram carbohydrate snacks include:  Glucose tablets, 3 or 4   Glucose gel, 15 gram tube  Raisins, 2 tablespoons (24 grams)  Jelly beans, 6  Animal crackers, 8  Regular pop, 4 ounces (120 mL)  Gummy treats, 9  Recognize hypoglycemia. Hypoglycemia occurs with blood glucose levels of 70 mg/dL and below. The risk for hypoglycemia increases when fasting or skipping meals, during or after intense exercise, and during sleep. Hypoglycemia symptoms can include:  Tremors or shakes.  Decreased ability to concentrate.  Sweating.  Increased heart rate.  Headache.  Dry mouth.  Hunger.  Irritability.  Anxiety.  Restless sleep.  Altered speech or coordination.  Confusion.  Treat hypoglycemia promptly. If you are alert and able to safely swallow, follow the 15:15 rule:  Take 15 20 grams of rapid-acting glucose or  carbohydrate. Rapid-acting options include glucose gel, glucose tablets, or 4 ounces (120 mL) of fruit juice, regular soda, or low fat milk.  Check your blood glucose level 15 minutes after taking the glucose.  Take 15 20 grams more of glucose if the repeat blood glucose level is still 70 mg/dL or below.  Eat a meal or snack within 1 hour once blood glucose levels return to normal.    Be alert to polyuria and polydipsia which are early signs of hyperglycemia. An early awareness of hyperglycemia allows for prompt treatment. Treat hyperglycemia as directed by your caregiver.  Engage in at least 150 minutes of moderate-intensity physical activity a week, spread over at least 3 days of the week or as directed by your caregiver. In addition, you should engage in resistance exercise at least 2 times a week or as directed by your caregiver.  Adjust your medicine and food intake as needed if you start a new exercise or sport.  Follow your sick day plan at any time you are unable to eat or drink as usual.  Avoid tobacco use.  Limit  alcohol intake to no more than 1 drink per day for nonpregnant women and 2 drinks per day for men. You should drink alcohol only when you are also eating food. Talk with your caregiver whether alcohol is safe for you. Tell your caregiver if you drink alcohol several times a week.  Follow up with your caregiver regularly.  Schedule an eye exam soon after the diagnosis of type 2 diabetes and then annually.  Perform daily skin and foot care. Examine your skin and feet daily for cuts, bruises, redness, nail problems, bleeding, blisters, or sores. A foot exam by a caregiver should be done annually.  Brush your teeth and gums at least twice a day and floss at least once a day. Follow up with your dentist regularly.  Share your diabetes management plan with your workplace or school.  Stay up-to-date with immunizations.  Learn to manage stress.  Obtain ongoing diabetes  education and support as needed.  Participate in, or seek rehabilitation as needed to maintain or improve independence and quality of life. Request a physical or occupational therapy referral if you are having foot or hand numbness or difficulties with grooming, dressing, eating, or physical activity. SEEK MEDICAL CARE IF:   You are unable to eat food or drink fluids for more than 6 hours.  You have nausea and vomiting for more than 6 hours.  Your blood glucose level is over 240 mg/dL.  There is a change in mental status.  You develop an additional serious illness.  You have diarrhea for more than 6 hours.  You have been sick or have had a fever for a couple of days and are not getting better.  You have pain during any physical activity.  SEEK IMMEDIATE MEDICAL CARE IF:  You have difficulty breathing.  You have moderate to large ketone levels. MAKE SURE YOU:  Understand these instructions.  Will watch your condition.  Will get help right away if you are not doing well or get worse. Document Released: 07/06/2005 Document Revised: 03/30/2012 Document Reviewed: 02/02/2012 [facility] Patient Information 2014 [place], [place].

## 2013-09-06 NOTE — Telephone Encounter (Signed)
FYI

## 2013-09-07 ENCOUNTER — Encounter: Payer: Self-pay | Admitting: Internal Medicine

## 2013-09-07 ENCOUNTER — Ambulatory Visit: Payer: PRIVATE HEALTH INSURANCE

## 2013-09-07 ENCOUNTER — Telehealth (INDEPENDENT_AMBULATORY_CARE_PROVIDER_SITE_OTHER): Payer: PRIVATE HEALTH INSURANCE | Admitting: *Deleted

## 2013-09-07 DIAGNOSIS — Z91119 Patient's noncompliance with dietary regimen due to unspecified reason: Secondary | ICD-10-CM | POA: Insufficient documentation

## 2013-09-07 DIAGNOSIS — E119 Type 2 diabetes mellitus without complications: Secondary | ICD-10-CM

## 2013-09-07 DIAGNOSIS — Z9111 Patient's noncompliance with dietary regimen: Secondary | ICD-10-CM | POA: Insufficient documentation

## 2013-09-07 LAB — COMPREHENSIVE METABOLIC PANEL
ALK PHOS: 80 U/L (ref 39–117)
ALT: 120 U/L — ABNORMAL HIGH (ref 0–53)
AST: 91 U/L — AB (ref 0–37)
Albumin: 4.3 g/dL (ref 3.5–5.2)
BUN: 20 mg/dL (ref 6–23)
CO2: 16 mEq/L — ABNORMAL LOW (ref 19–32)
CREATININE: 1.4 mg/dL (ref 0.4–1.5)
Calcium: 9.6 mg/dL (ref 8.4–10.5)
Chloride: 90 mEq/L — ABNORMAL LOW (ref 96–112)
GFR: 59.6 mL/min — ABNORMAL LOW (ref >60.00)
Glucose, Bld: 547 mg/dL (ref 70–99)
Potassium: 5 mEq/L (ref 3.5–5.1)
Sodium: 125 mEq/L — ABNORMAL LOW (ref 135–145)
Total Bilirubin: 1.3 mg/dL — ABNORMAL HIGH (ref 0.3–1.2)
Total Protein: 7.5 g/dL (ref 6.0–8.3)

## 2013-09-07 LAB — HEMOGLOBIN A1C: HEMOGLOBIN A1C: 12.6 % — AB (ref 4.6–6.5)

## 2013-09-07 LAB — GLUCOSE, POCT (MANUAL RESULT ENTRY): POC Glucose: 387 mg/dl — AB (ref 70–99)

## 2013-09-07 NOTE — Assessment & Plan Note (Addendum)
Complete loss of control in the last 3 months Secondary to dietary and medication noncompliance.  Starting mixed insulin and [place].  Low GI diet advised and referral [facility] education semin[facility] Lab Results  Component Value Date   HGBA1C 12.6* 09/06/2013

## 2013-09-07 NOTE — Telephone Encounter (Signed)
Gave critical to dr,Scott an explained what you did yesterday, and called patient to come by and let me check CBG patient stated he felt great. I am giving patient glucometer also.

## 2013-09-07 NOTE — Telephone Encounter (Signed)
[facility] called pt has a Glucose of: 547

## 2013-09-07 NOTE — Telephone Encounter (Signed)
Ran cbg checked by me BY Raquel Rey NP of [id number] and she talked with patient.

## 2013-09-07 NOTE — Assessment & Plan Note (Signed)
Not following a sensible diet   Long discussion about diabetes and diet.  Referral to [facility] seminars.

## 2013-09-08 ENCOUNTER — Telehealth: Payer: Self-pay

## 2013-09-08 ENCOUNTER — Ambulatory Visit: Payer: PRIVATE HEALTH INSURANCE

## 2013-09-08 NOTE — Addendum Note (Signed)
Addended by: Sherlene ShamsULLO, TERESA L on: 09/08/2013 06:43 AM   Modules accepted: Orders

## 2013-09-08 NOTE — Telephone Encounter (Signed)
Relevant patient education assigned to patient using Emmi. ° °

## 2013-09-11 ENCOUNTER — Other Ambulatory Visit: Payer: PRIVATE HEALTH INSURANCE

## 2013-09-12 ENCOUNTER — Other Ambulatory Visit: Payer: PRIVATE HEALTH INSURANCE

## 2013-09-13 ENCOUNTER — Other Ambulatory Visit: Payer: PRIVATE HEALTH INSURANCE

## 2013-09-14 ENCOUNTER — Ambulatory Visit: Payer: PRIVATE HEALTH INSURANCE | Admitting: Internal Medicine

## 2013-09-15 ENCOUNTER — Encounter: Payer: Self-pay | Admitting: Internal Medicine

## 2013-09-15 ENCOUNTER — Ambulatory Visit (INDEPENDENT_AMBULATORY_CARE_PROVIDER_SITE_OTHER): Payer: PRIVATE HEALTH INSURANCE | Admitting: Internal Medicine

## 2013-09-15 VITALS — BP 128/72 | HR 100 | Temp 98.0°F | Resp 18 | Wt 282.8 lb

## 2013-09-15 DIAGNOSIS — E1129 Type 2 diabetes mellitus with other diabetic kidney complication: Secondary | ICD-10-CM

## 2013-09-15 DIAGNOSIS — E1110 Type 2 diabetes mellitus with ketoacidosis without coma: Secondary | ICD-10-CM

## 2013-09-15 DIAGNOSIS — E1310 Other specified diabetes mellitus with ketoacidosis without coma: Secondary | ICD-10-CM

## 2013-09-15 DIAGNOSIS — E1165 Type 2 diabetes mellitus with hyperglycemia: Secondary | ICD-10-CM

## 2013-09-15 MED ORDER — GLUCOSE BLOOD VI STRP: ORAL_STRIP | Status: DC

## 2013-09-15 MED ORDER — INSULIN LISPRO PROT & LISPRO (75-25 MIX) 100 UNIT/ML KWIKPEN: PEN_INJECTOR | SUBCUTANEOUS | Status: AC

## 2013-09-15 NOTE — Progress Notes (Signed)
Pre-visit discussion using our clinic review tool. No additional management support is needed unless otherwise documented below in the visit note.  

## 2013-09-15 NOTE — Progress Notes (Signed)
Patient ID: Nathaniel Flores, male   DOB: 1975-07-31, 38 y.o.   MRN: [id number]  Patient Active Problem List   Diagnosis Date Noted  . Dietary noncompliance 09/07/2013  . Yeast infection 08/31/2013  . Frequency of urination 08/31/2013  . OSA (obstructive sleep apnea) 06/09/2013  . Acute renal failure 05/30/2013  . Fatty liver disease, nonalcoholic 16/96/7893  . Nephrolithiasis 08/24/2012  . Inguinal hernia 08/24/2012  . Morbid obesity with BMI of 40.0-44.9, adult 02/16/2012  . Rectal bleeding 02/16/2012  . Hyperlipidemia 05/11/2011  . Type II or unspecified type diabetes mellitus with renal manifestations, uncontrolled 05/11/2011    Subjective:  CC:   Chief Complaint  Patient presents with  . Diabetes    follow up    HPI:   Nathaniel Flores is a 38 y.o. male who presents for  Follow up on uncontrolled DM.  He was seen two weeks ago, with muscle cramps and uncontrolled DM, BS in the 500 range.   Lab Results  Component Value Date   HGBA1C 12.6* 09/06/2013   He was started on Januvia and 75/25  insulin twice daily with samples.  His blood sugars have been improving and are now consistently below 300.  today his fasting sugar was  217 fasting,  284 post prandial  Using 20 units in the morning and 25 units in the evening.     Past Medical History  Diagnosis Date  . Hypertension   . Hyperlipidemia   . Diabetes mellitus     borderline    Past Surgical History  Procedure Laterality Date  . Ankle surgery    . Ankle fracture surgery  2004       The following portions of the patient's history were reviewed and updated as appropriate: Allergies, current medications, and problem list.    Review of Systems:   Patient denies headache, fevers, malaise, unintentional weight loss, skin rash, eye pain, sinus congestion and sinus pain, sore throat, dysphagia,  hemoptysis , cough, dyspnea, wheezing, chest pain, palpitations, orthopnea, edema, abdominal pain, nausea, melena, diarrhea,  constipation, flank pain, dysuria, hematuria, urinary  Frequency, nocturia, numbness, tingling, seizures,  Focal weakness, Loss of consciousness,  Tremor, insomnia, depression, anxiety, and suicidal ideation.     History   Social History  . Marital Status: Single    Spouse Name: N/A    Number of Children: N/A  . Years of Education: N/A   Occupational History  . Not on file.   Social History Main Topics  . Smoking status: Never Smoker   . Smokeless tobacco: Never Used  . Alcohol Use: 0.6 oz/week    1 Cans of beer per week  . Drug Use: No  . Sexual Activity: Yes   Other Topics Concern  . Not on file   Social History Narrative  . No narrative on file    Objective:  Filed Vitals:   09/15/13 1649  BP: 128/72  Pulse: 100  Temp: 98 F (36.7 C)  Resp: 18     General appearance: alert, cooperative and appears stated age Ears: normal TM's and external ear canals both ears Throat: lips, mucosa, and tongue normal; teeth and gums normal Neck: no adenopathy, no carotid bruit, supple, symmetrical, trachea midline and thyroid not enlarged, symmetric, no tenderness/mass/nodules Back: symmetric, no curvature. ROM normal. No CVA tenderness. Lungs: clear to auscultation bilaterally Heart: regular rate and rhythm, S1, S2 normal, no murmur, click, rub or gallop Abdomen: soft, non-tender; bowel sounds normal; no masses,  no organomegaly  Pulses: 2+ and symmetric Skin: Skin color, texture, turgor normal. No rashes or lesions Lymph nodes: Cervical, supraclavicular, and axillary nodes normal.  Assessment and Plan:  Type II or unspecified type diabetes mellitus with renal manifestations, uncontrolled Secondary to dietary concompliance.  Improving with januvia and 75/25 insulin   iNcreasing both doses by 5 units.    Updated Medication List Outpatient Encounter Prescriptions as of 09/15/2013  Medication Sig  . aspirin 81 MG tablet Take 81 mg by mouth daily.    Marland Kitchen  HYDROcodone-acetaminophen (NORCO) 10-325 MG per tablet Take 1 tablet by mouth every 6 (six) hours as needed.  . Insulin Lispro Prot & Lispro (HUMALOG MIX 75/25 KWIKPEN) (75-25) 100 UNIT/ML Kwikpen 25 units  before breakfast and 30 units before dinner  . nystatin-triamcinolone ointment (MYCOLOG) Apply 1 application topically 2 (two) times daily.  . rosuvastatin (CRESTOR) 10 MG tablet Take 1 tablet (10 mg total) by mouth daily.  . sitaGLIPtin (JANUVIA) 100 MG tablet Take 1 tablet (100 mg total) by mouth daily.  . [DISCONTINUED] Insulin Lispro Prot & Lispro (HUMALOG MIX 75/25 KWIKPEN) (75-25) 100 UNIT/ML Kwikpen 15 units before breakfast and 25 units before dinner  . glucose blood (BAYER CONTOUR NEXT TEST) test strip Use  Three times daily to check blood sugars  250.02  . nitroGLYCERIN (NITROSTAT) 0.4 MG SL tablet Place 1 tablet (0.4 mg total) under the tongue every 5 (five) minutes as needed for chest pain.  . [DISCONTINUED] levofloxacin (LEVAQUIN) 500 MG tablet Take 1 tablet daily for 7 days.     Orders Placed This Encounter  Procedures  . Comp Met (CMET)    No Follow-up on file.

## 2013-09-15 NOTE — Patient Instructions (Addendum)
Increase the insulin to 25 units in the morning and the 30 units in the evening   After a few days at that level, increase by 3 units every 3 days until fasting are < 140 and  post lunch is < 150   Call if this regimen causess any  low blood sugars (< 80)   Use Mychart to send me your sugars in two week  Follow up in 3 months OV

## 2013-09-16 LAB — COMPREHENSIVE METABOLIC PANEL
ALT: 73 U/L — AB (ref 0–53)
AST: 41 U/L — AB (ref 0–37)
Albumin: 3.9 g/dL (ref 3.5–5.2)
Alkaline Phosphatase: 78 U/L (ref 39–117)
BILIRUBIN TOTAL: 0.4 mg/dL (ref 0.2–1.2)
BUN: 16 mg/dL (ref 6–23)
CHLORIDE: 98 meq/L (ref 96–112)
CO2: 24 mEq/L (ref 19–32)
Calcium: 9.8 mg/dL (ref 8.4–10.5)
Creat: 1.1 mg/dL (ref 0.50–1.35)
Glucose, Bld: 373 mg/dL — ABNORMAL HIGH (ref 70–99)
Potassium: 4.9 mEq/L (ref 3.5–5.3)
Sodium: 132 mEq/L — ABNORMAL LOW (ref 135–145)
Total Protein: 6.3 g/dL (ref 6.0–8.3)

## 2013-09-17 ENCOUNTER — Encounter: Payer: Self-pay | Admitting: Internal Medicine

## 2013-09-17 NOTE — Assessment & Plan Note (Signed)
Secondary to dietary concompliance.  Improving with januvia and 75/25 insulin   iNcreasing both doses by 5 units.

## 2013-09-19 ENCOUNTER — Telehealth: Payer: Self-pay

## 2013-09-19 ENCOUNTER — Ambulatory Visit: Payer: PRIVATE HEALTH INSURANCE | Admitting: Internal Medicine

## 2013-09-19 NOTE — Telephone Encounter (Signed)
Relevant patient education assigned to patient using Emmi. ° °

## 2013-09-27 ENCOUNTER — Telehealth: Payer: Self-pay | Admitting: Internal Medicine

## 2013-09-27 NOTE — Telephone Encounter (Signed)
Pt called to rescheudle his 3/13 appointment to 3/18  He is out of his pain meds  Hydro condone And would like to get rx asap Please let pt know when rx is ready for pick up

## 2013-09-28 MED ORDER — HYDROCODONE-ACETAMINOPHEN 10-325 MG PO TABS: 1.0000 | ORAL_TABLET | Freq: Four times a day (QID) | ORAL | Status: DC | PRN

## 2013-09-28 NOTE — Telephone Encounter (Signed)
Pt notified Rx ready for pickup 

## 2013-09-28 NOTE — Telephone Encounter (Signed)
Ok refill? 

## 2013-09-28 NOTE — Telephone Encounter (Signed)
Ok to refill,  printed rx  

## 2013-09-28 NOTE — Telephone Encounter (Signed)
Patient called to see if his prescription was ready

## 2013-09-29 ENCOUNTER — Telehealth: Payer: Self-pay | Admitting: *Deleted

## 2013-09-29 ENCOUNTER — Ambulatory Visit: Payer: PRIVATE HEALTH INSURANCE | Admitting: Internal Medicine

## 2013-09-29 DIAGNOSIS — E1165 Type 2 diabetes mellitus with hyperglycemia: Principal | ICD-10-CM

## 2013-09-29 DIAGNOSIS — E1129 Type 2 diabetes mellitus with other diabetic kidney complication: Secondary | ICD-10-CM

## 2013-09-29 MED ORDER — ONETOUCH ULTRA SYSTEM W/DEVICE KIT: 1.0000 | PACK | Freq: Once | Status: AC

## 2013-09-29 NOTE — Telephone Encounter (Signed)
Pharmacy Note:  Pt is request change to One touch strips, please send rx for One touch strips and meter

## 2013-09-29 NOTE — Telephone Encounter (Signed)
Refill sent as requested. 

## 2013-10-02 ENCOUNTER — Telehealth: Payer: Self-pay | Admitting: Internal Medicine

## 2013-10-02 NOTE — Telephone Encounter (Signed)
The patient has a question about his insuline.

## 2013-10-03 NOTE — Telephone Encounter (Signed)
Phoned patient left message for patient to return call.

## 2013-10-04 ENCOUNTER — Ambulatory Visit: Payer: PRIVATE HEALTH INSURANCE | Admitting: Internal Medicine

## 2013-10-04 NOTE — Telephone Encounter (Signed)
Phoned patient left voicemail to call office.

## 2013-10-17 ENCOUNTER — Other Ambulatory Visit: Payer: Self-pay | Admitting: *Deleted

## 2013-10-17 MED ORDER — GLUCOSE BLOOD VI STRP: ORAL_STRIP | Status: AC

## 2013-10-17 NOTE — Telephone Encounter (Signed)
Called for refill on  One touch ultra test strips. Refill sent electronically.

## 2013-10-30 ENCOUNTER — Other Ambulatory Visit: Payer: Self-pay | Admitting: *Deleted

## 2013-10-30 ENCOUNTER — Telehealth: Payer: Self-pay | Admitting: Internal Medicine

## 2013-10-30 NOTE — Telephone Encounter (Signed)
Electronic Rx request, Please advise. 

## 2013-10-30 NOTE — Telephone Encounter (Signed)
HYDROcodone-acetaminophen (NORCO) 10-325 MG per tablet

## 2013-10-31 MED ORDER — HYDROCODONE-ACETAMINOPHEN 10-325 MG PO TABS: 1.0000 | ORAL_TABLET | Freq: Four times a day (QID) | ORAL | Status: DC | PRN

## 2013-10-31 NOTE — Telephone Encounter (Signed)
Pt notified Rx ready for pickup 

## 2013-11-30 ENCOUNTER — Telehealth: Payer: Self-pay | Admitting: *Deleted

## 2013-11-30 MED ORDER — HYDROCODONE-ACETAMINOPHEN 10-325 MG PO TABS: 1.0000 | ORAL_TABLET | Freq: Four times a day (QID) | ORAL | Status: DC | PRN

## 2013-11-30 NOTE — Telephone Encounter (Signed)
Patient called to give up date on CBG's patient also stated the reason for no show was not aware of appointment change,patient scheduled appointment for 01/08/14 to follow up. Patient also has switched jobs , and moved will up date chart at appointment. Patient requesting refill on hydrocodone please advise ok to fill.

## 2013-11-30 NOTE — Telephone Encounter (Signed)
Ok to refill,  printed rx  

## 2013-11-30 NOTE — Telephone Encounter (Signed)
Patient stated Fasting CBG's have been from 105 - 127 and post prandials have been less than 130 he has reduced insulin by 5 units.FYI Patient notified script ready>

## 2014-01-05 ENCOUNTER — Telehealth: Payer: Self-pay | Admitting: Internal Medicine

## 2014-01-05 DIAGNOSIS — E1165 Type 2 diabetes mellitus with hyperglycemia: Principal | ICD-10-CM

## 2014-01-05 DIAGNOSIS — IMO0001 Reserved for inherently not codable concepts without codable children: Secondary | ICD-10-CM

## 2014-01-05 MED ORDER — HYDROCODONE-ACETAMINOPHEN 10-325 MG PO TABS: 1.0000 | ORAL_TABLET | Freq: Four times a day (QID) | ORAL | Status: AC | PRN

## 2014-01-05 NOTE — Telephone Encounter (Signed)
Patient called requesting refill on Norco , also reminded patient of appointment on 01/08/14

## 2014-01-05 NOTE — Telephone Encounter (Signed)
He needs to have his diabetes labs drawn today before I will relearse his Norco rx, because he has missed two appts for follow up on uncontrolled DM.  He needs a urine drug screen as well.

## 2014-01-08 ENCOUNTER — Ambulatory Visit (INDEPENDENT_AMBULATORY_CARE_PROVIDER_SITE_OTHER): Payer: PRIVATE HEALTH INSURANCE | Admitting: Internal Medicine

## 2014-01-08 DIAGNOSIS — E1165 Type 2 diabetes mellitus with hyperglycemia: Principal | ICD-10-CM

## 2014-01-08 DIAGNOSIS — E1129 Type 2 diabetes mellitus with other diabetic kidney complication: Secondary | ICD-10-CM

## 2014-01-08 NOTE — Progress Notes (Signed)
Patient ID: Nathaniel Flores, male   DOB: 10/14/1975, 38 y.o.   MRN: [id number]   Patient no showed for the third time.  Incidentally he picked up his vicodin rx last Friday after missing his appt and rescheduling , ad was reminded of his appt today

## 2014-01-16 ENCOUNTER — Encounter: Payer: Self-pay | Admitting: Internal Medicine

## 2014-01-22 ENCOUNTER — Telehealth: Payer: Self-pay | Admitting: Internal Medicine

## 2014-01-22 NOTE — Telephone Encounter (Signed)
Dismissal Letter sent by Certified Mail 01/22/2014

## 2014-02-09 ENCOUNTER — Telehealth: Payer: Self-pay | Admitting: Internal Medicine

## 2014-02-09 NOTE — Telephone Encounter (Signed)
The patient is needing a refill on his medication until he can be seen by his new doctor on 8.27.15.   HYDROcodone-acetaminophen (NORCO) 10-325 MG per tablet

## 2014-02-09 NOTE — Telephone Encounter (Signed)
Patient has been dis- missed from practice and is requesting refill? Please advise.

## 2014-02-12 NOTE — Telephone Encounter (Signed)
Patient notified odf discharge admitted he received letter.

## 2014-02-21 ENCOUNTER — Telehealth: Payer: Self-pay | Admitting: Internal Medicine

## 2014-02-21 NOTE — Telephone Encounter (Signed)
Patient Information    Patient Name Sex DOB SSN   Fredirick LatheWood, Callie R Male 12/10/1975 [id number]            Telephone Encounter by Lilian KapurAnita S Walker at 01/22/2014 8:43 AM    Author: Lilian KapurAnita S Walker Service: (none) Author Type: (none)   Filed: 01/22/2014 8:47 AM Note Time: 01/22/2014 8:43 AM Status: Signed   Editor: Lilian KapurAnita S Walker      Dismissal Letter sent by Certified Mail 01/22/2014           Received the Return Receipt showing someone picked up the Dismissal 02/21/2014

## 2014-07-19 IMAGING — CT CT STONE STUDY
1 of 2 series · 15 of 32 positions shown, 19 images · non-contrast
Comparison: none

REASON FOR EXAM: l flank pain, h/o stones
COMMENTS:   May transport without cardiac monitor

PROCEDURE:     CT  - CT ABDOMEN /PELVIS WO (STONE)  - August 03, 2012 [DATE]
RESULT:     Comparison: 01/29/2012
TECHNIQUE: Multiple axial images from the lung bases to the symphysis pubis
were obtained without oral and without intravenous contrast.

[Series 2: 3mm soft tissue · axial · 0.93mm/px · z∈[-1106,-608]mm · 15 of 182 slices shown, 19 images]
[im 8/182  soft-tissue]
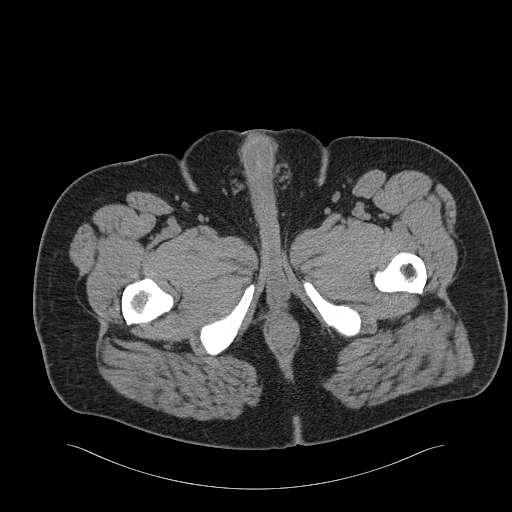
[im 8/182  bone]
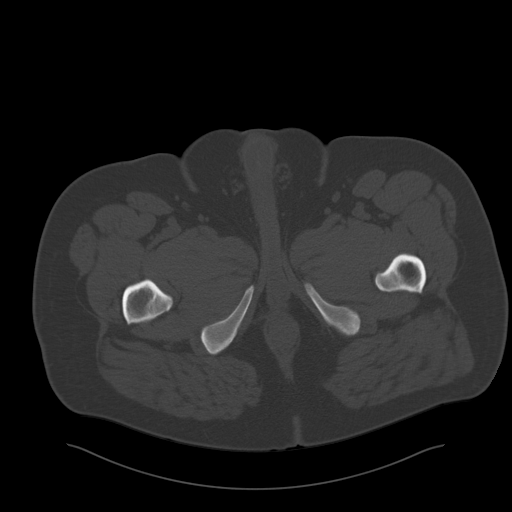
[im 24/182  soft-tissue]
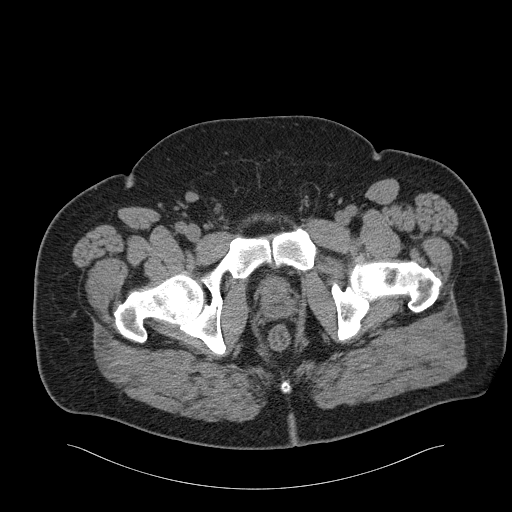
[im 40/182  soft-tissue]
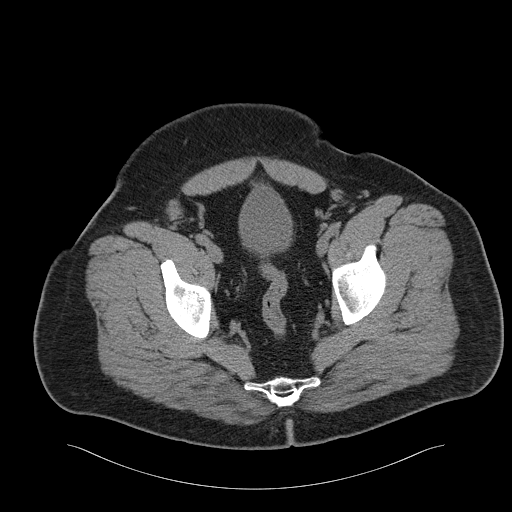
[im 48/182  soft-tissue]
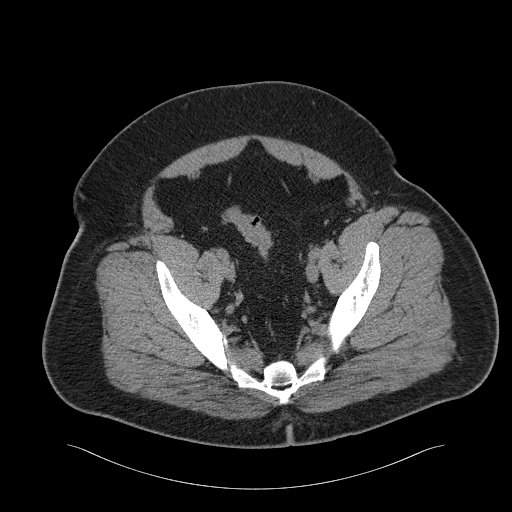
[im 63/182  soft-tissue]
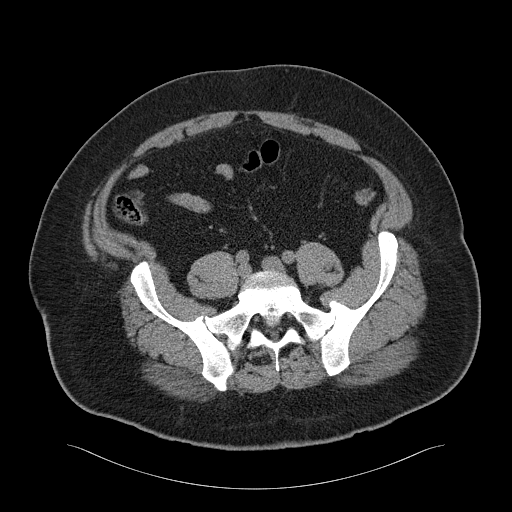
[im 79/182  soft-tissue]
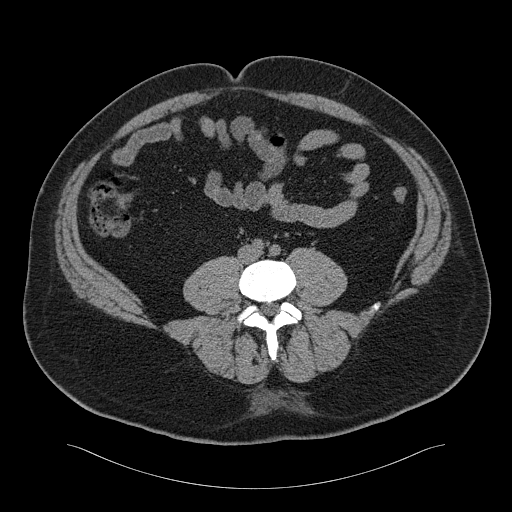
[im 95/182  soft-tissue]
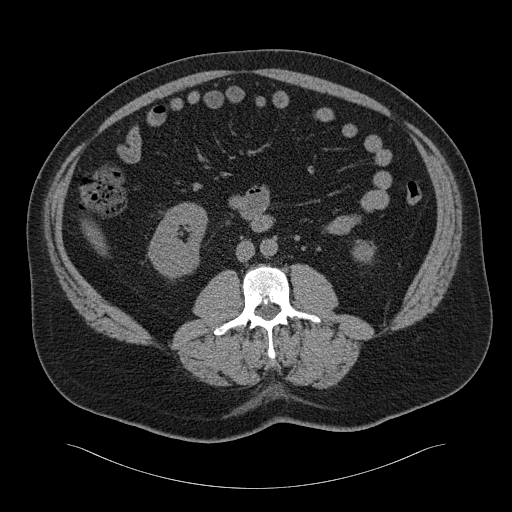
[im 103/182  soft-tissue]
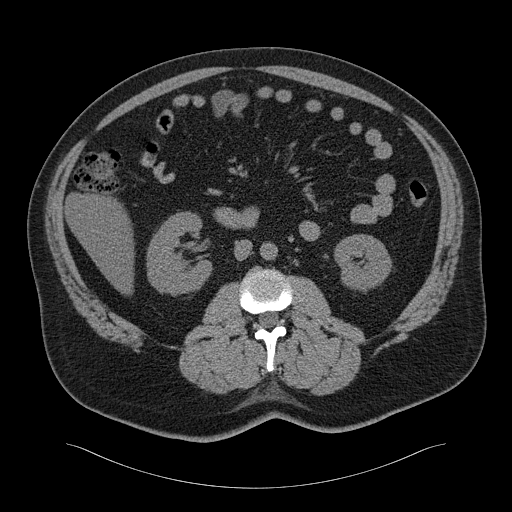
[im 119/182  soft-tissue]
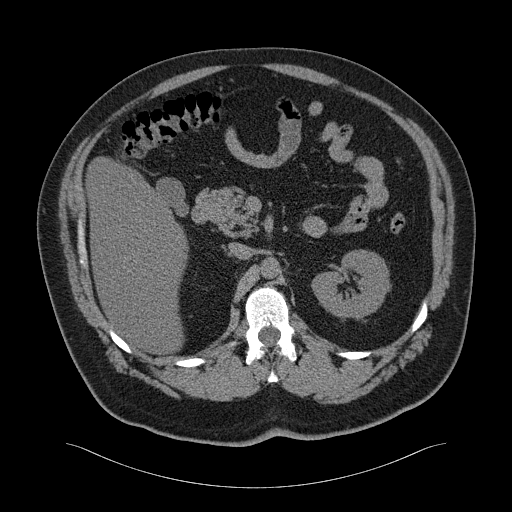
[im 119/182  bone]
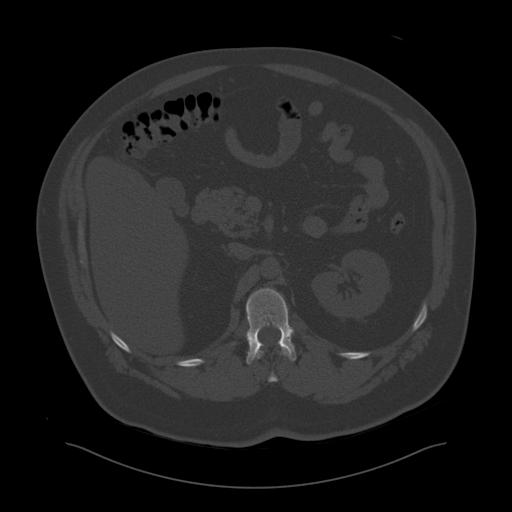
[im 134/182  soft-tissue]
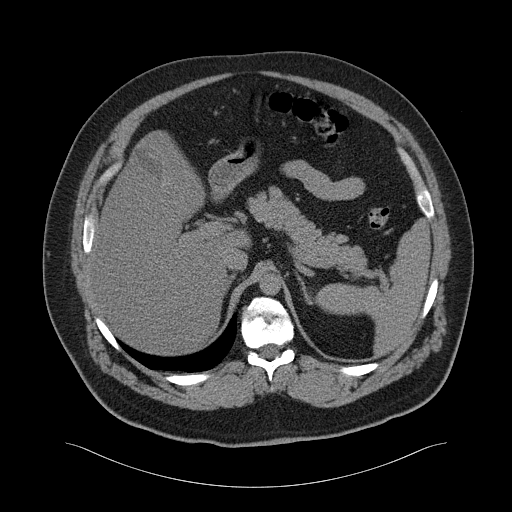
[im 142/182  soft-tissue]
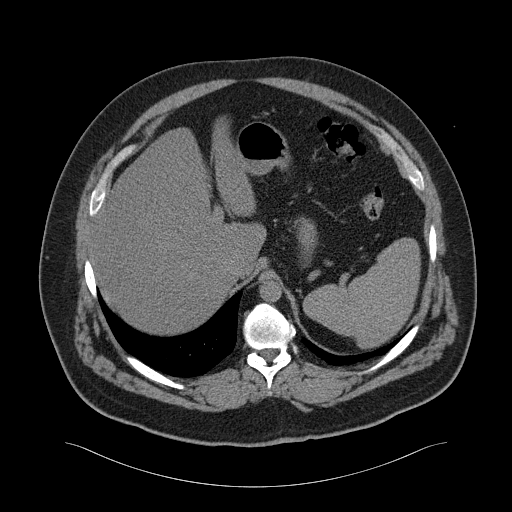
[im 150/182  lung]
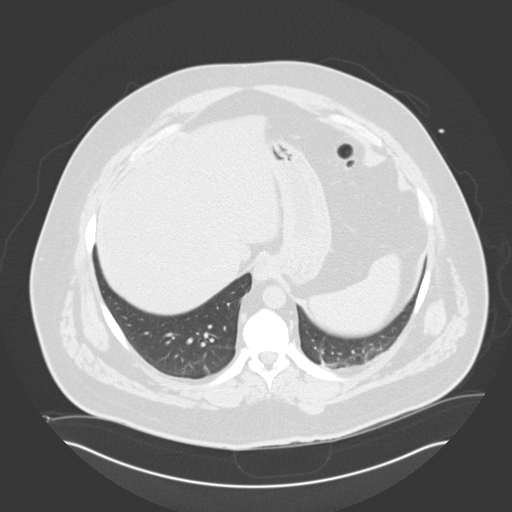
[im 158/182  soft-tissue]
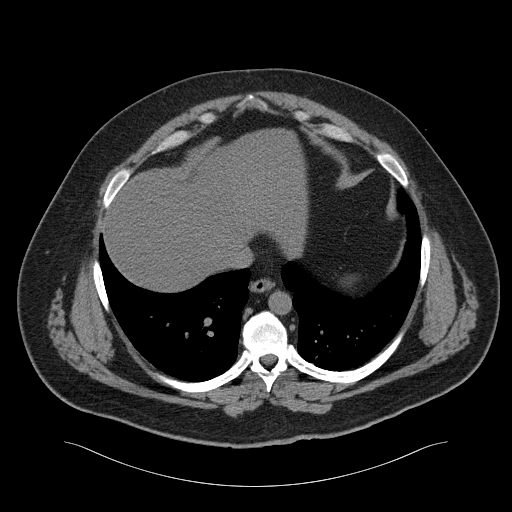
[im 158/182  lung]
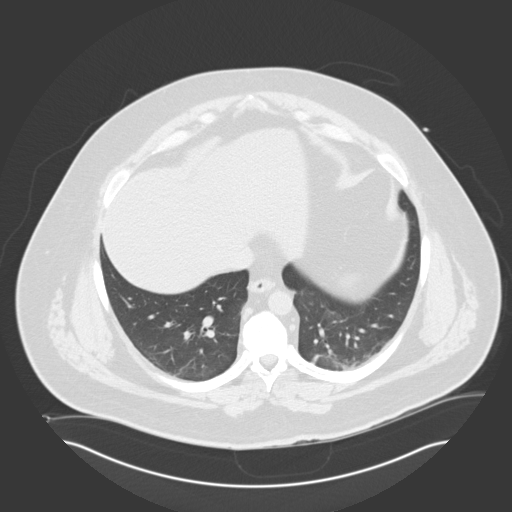
[im 166/182  lung]
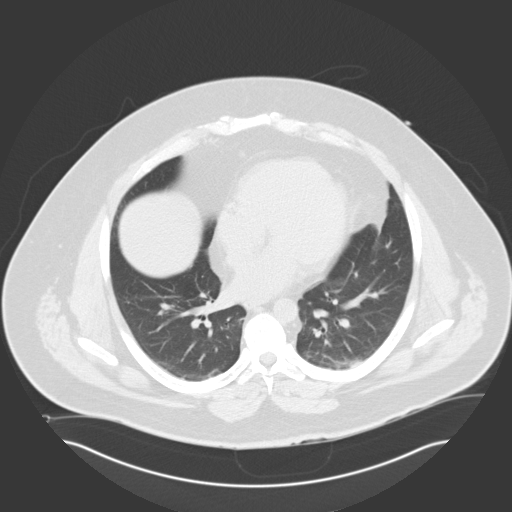
[im 174/182  soft-tissue]
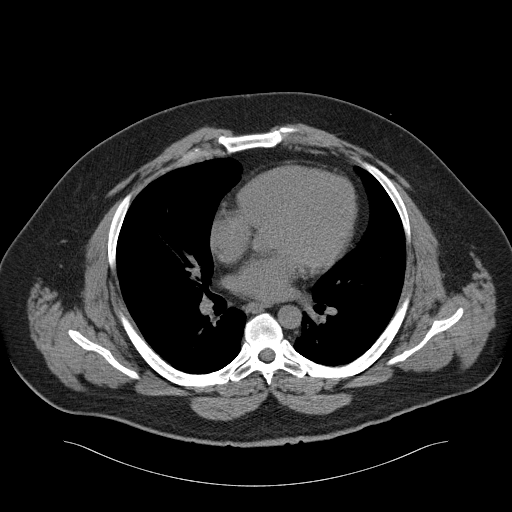
[im 174/182  lung]
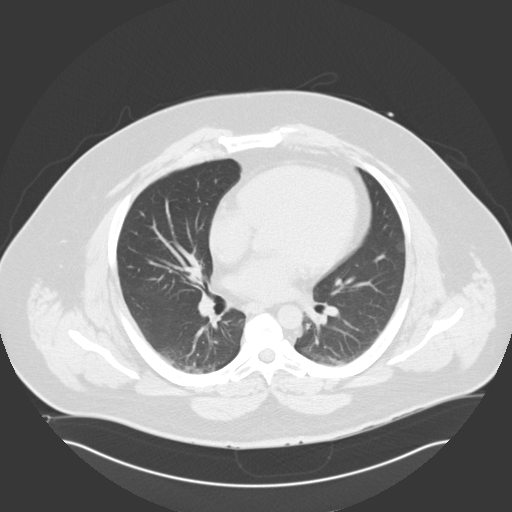

[15 of 32 positions shown; findings below may reference images not displayed]

FINDINGS: Mild basilar opacities are likely secondary to atelectasis.

Lack of intravenous contrast limits evaluation of the solid abdominal
organs.  The liver is somewhat low in attenuation, likely secondary to
hepatic steatosis. Areas of increased attenuation adjacent the gallbladder
fossa are likely secondary to focal fatty sparing. There are a few calcified
gallstones in the gallbladder. The spleen, adrenals, and pancreas are
unremarkable.

There is a 3 mm calculus in the left kidney. There is a 3 mm calculus in the
right kidney. No hydronephrosis. No ureterectasis.

The small and large bowel are normal in caliber. The appendix is normal.

No aggressive lytic or sclerotic osseous lesions are identified.
IMPRESSION: Bilateral nephrolithiasis, without hydronephrosis.

## 2014-08-15 IMAGING — US ABDOMEN ULTRASOUND LIMITED
2 series · 14 of 25 positions shown · non-contrast
Comparison: none

REASON FOR EXAM: Elevated liver functions Diabetes Morbid Obesity
COMMENTS:

PROCEDURE:     SWARTZ - SWARTZ ABDOMEN UPPER GENERAL  - August 30, 2012  [DATE]
RESULT:     History:  Elevated liver function tests.
Comparison Study: CT of 08/03/2012.

[Series 1: abdomen ultrasound limited · 0.38mm/px · 13 of 85 slices shown (1 of 2)]
[im 1/85]
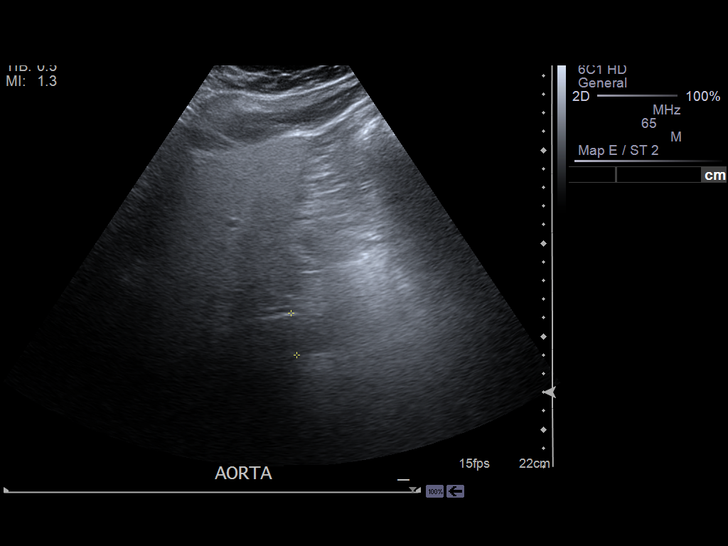
[im 8/85]
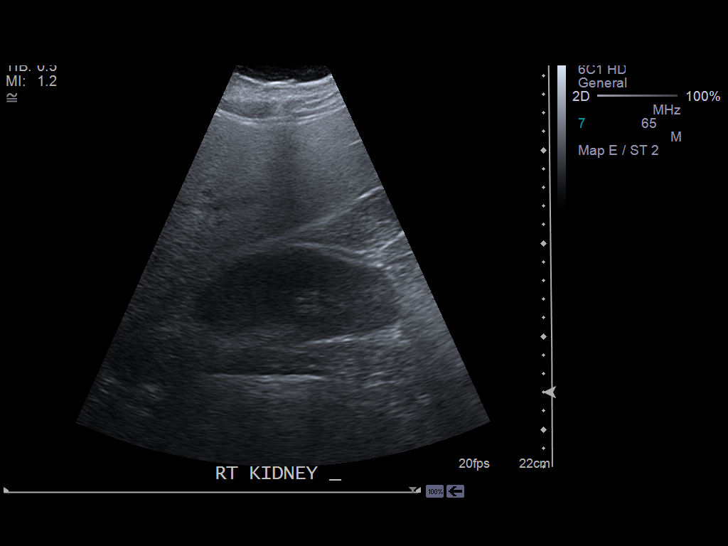
[im 15/85]
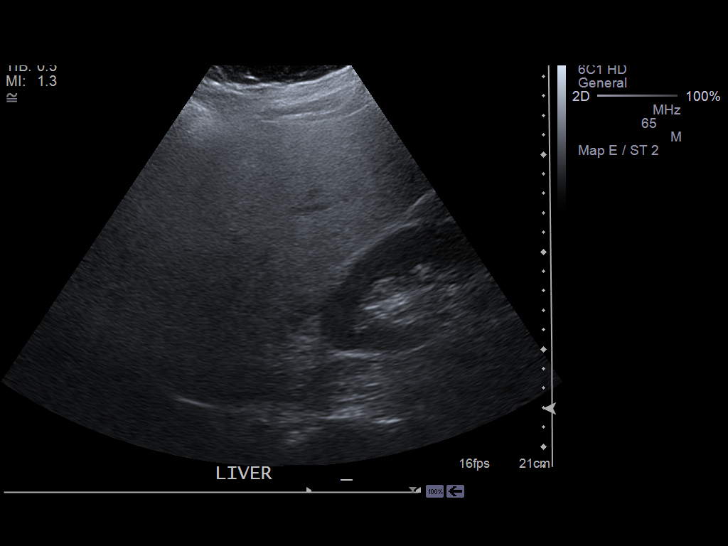
[im 22/85]
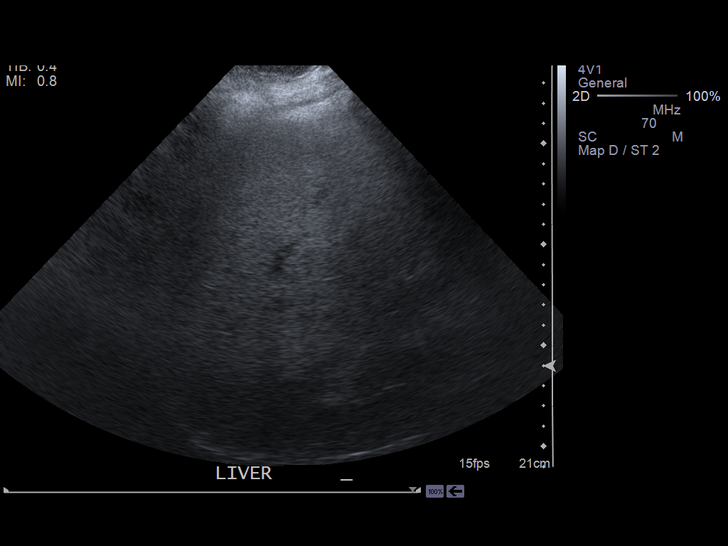
[im 30/85]
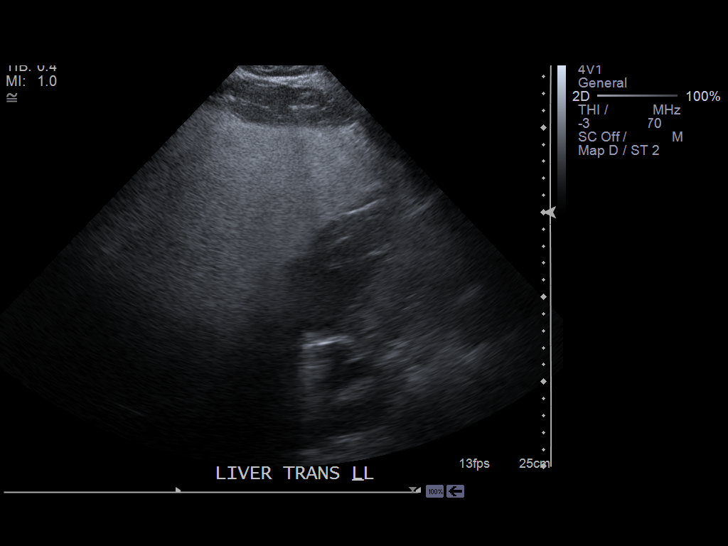
[im 33/85]
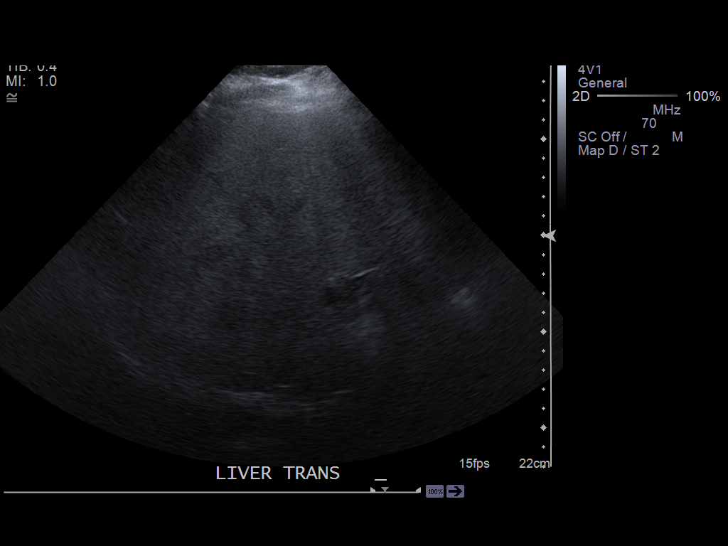
[im 41/85]
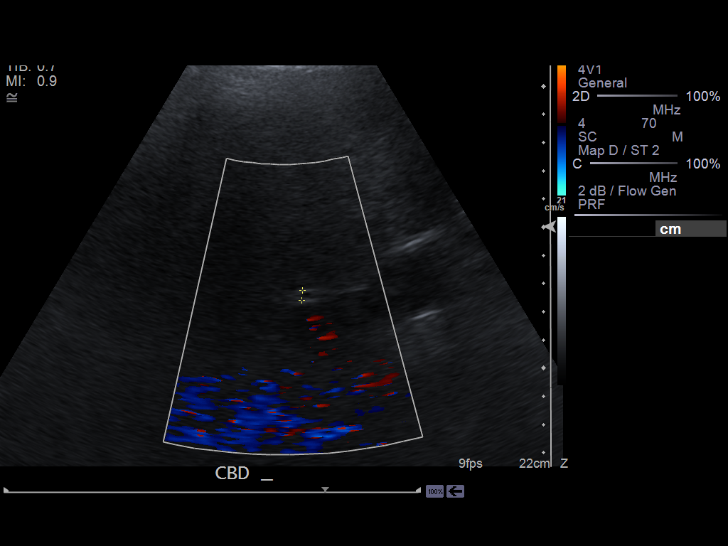
[im 48/85]
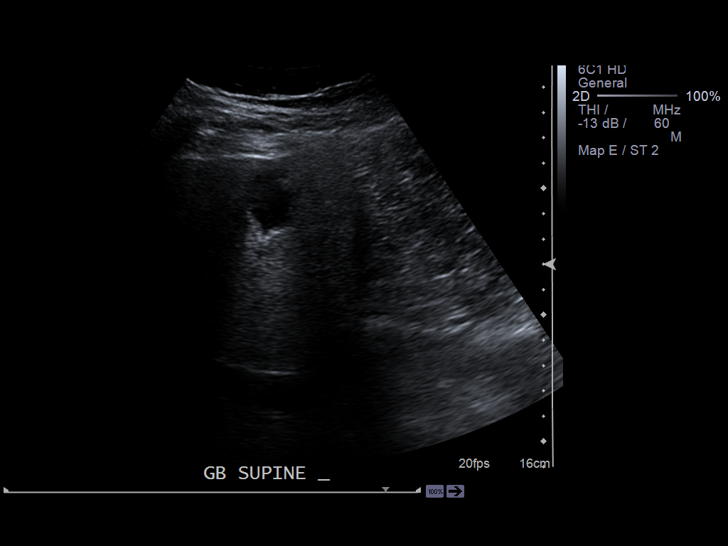
[im 55/85]
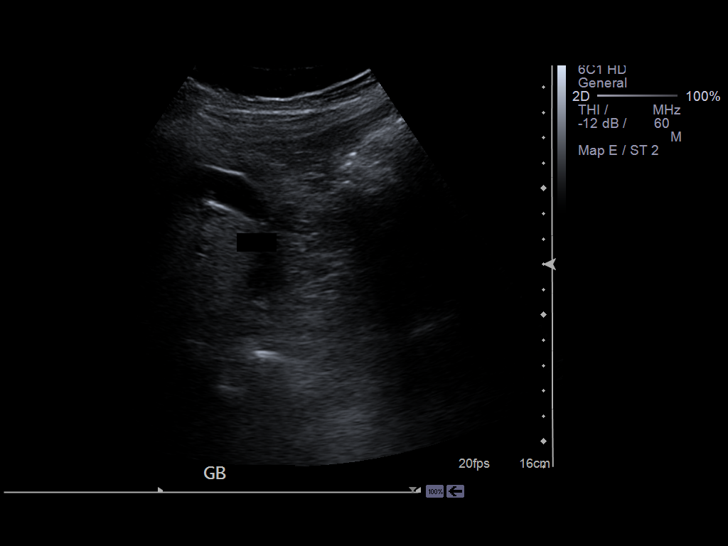
[im 59/85]
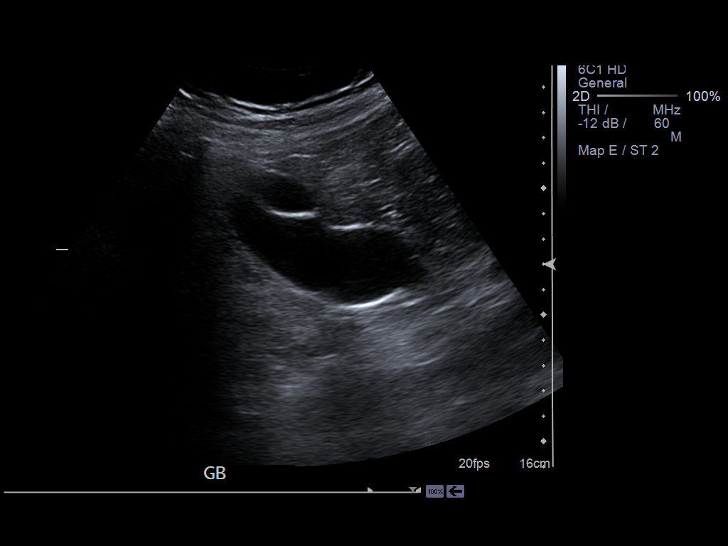
[im 66/85]
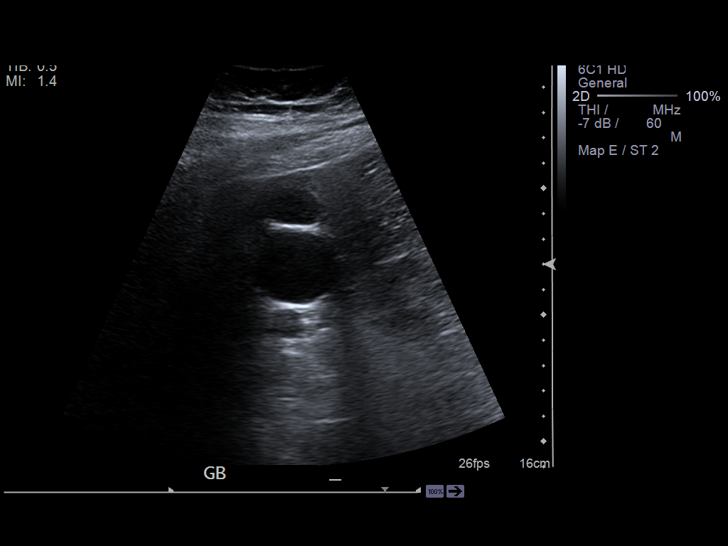
[im 74/85]
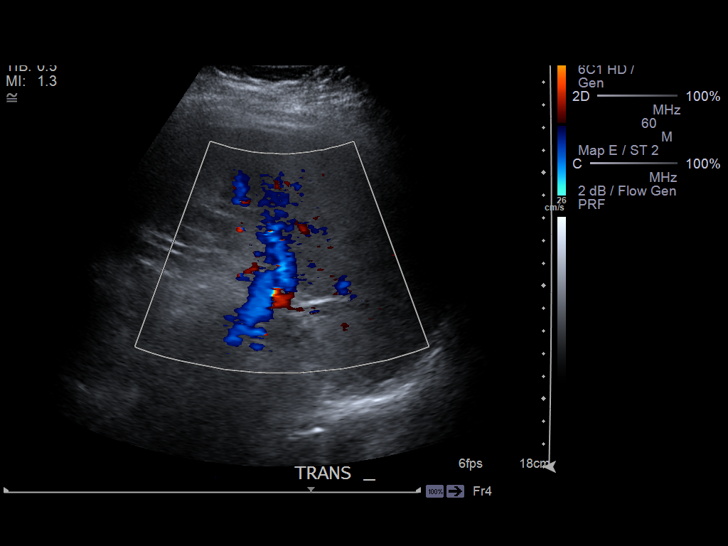
[im 81/85]
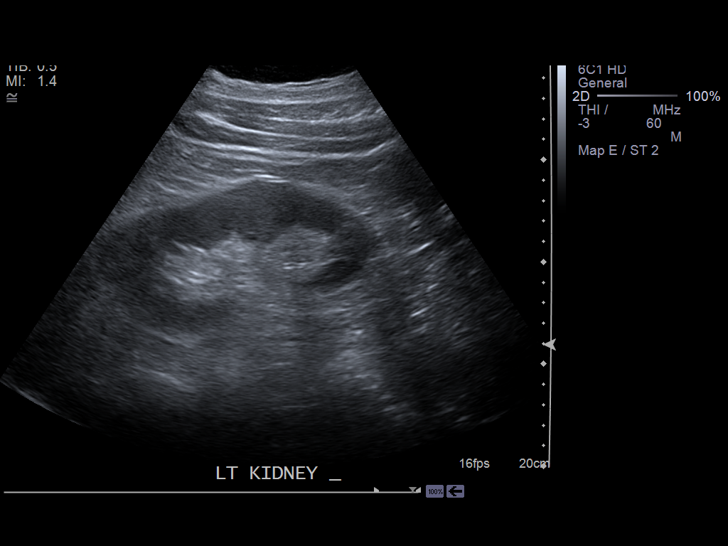

[Series 2001: abdomen ultrasound limited · 0.30mm/px · 1 of 1 slices shown (2 of 2)]
[im 1/1]
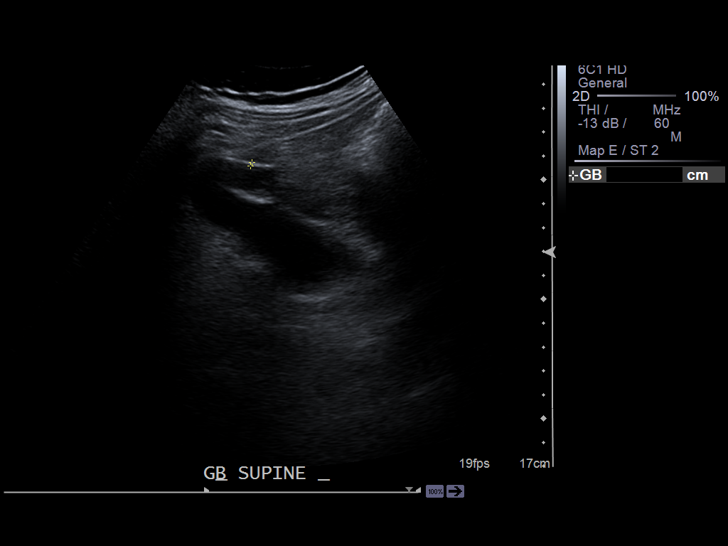

[14 of 25 positions shown; findings below may reference images not displayed]

FINDINGS: Hepatomegaly. Spleen upper limits normal at 13.1 cm. Liver is
echogenic consistent with fatty infiltration or hepatocellular disease.
Gallbladder normal. Common bile duct caliber 4.0 mm. Pancreas obscured by
bowel loops. 9 mm left renal stone.
IMPRESSION: Mild hepatomegaly and borderline splenomegaly. Echogenic
liver suggesting diffuse fatty infiltration or hepatocellular disease. 9 mm
left renal stone.

## 2014-11-07 ENCOUNTER — Other Ambulatory Visit: Payer: Self-pay | Admitting: Internal Medicine

## 2014-11-08 NOTE — Telephone Encounter (Signed)
Pt needs to schedule appoint for refills

## 2014-11-08 NOTE — Telephone Encounter (Signed)
Left message on pt's VM to return call.

## 2015-05-19 ENCOUNTER — Other Ambulatory Visit: Payer: Self-pay | Admitting: Internal Medicine
